# Patient Record
Sex: Female | Born: 1965 | Race: White | Hispanic: Yes | Marital: Married | Smoking: Never smoker
Health system: Southern US, Community
[De-identification: ages and names within clinical notes are randomized; demographics above are authoritative.]

## PROBLEM LIST (undated history)

## (undated) DIAGNOSIS — L309 Dermatitis, unspecified: Secondary | ICD-10-CM

## (undated) DIAGNOSIS — F32A Depression, unspecified: Secondary | ICD-10-CM

## (undated) DIAGNOSIS — F329 Major depressive disorder, single episode, unspecified: Secondary | ICD-10-CM

## (undated) DIAGNOSIS — F419 Anxiety disorder, unspecified: Secondary | ICD-10-CM

## (undated) HISTORY — PX: ENDOMETRIAL ABLATION: SHX621

## (undated) HISTORY — DX: Depression, unspecified: F32.A

## (undated) HISTORY — DX: Anxiety disorder, unspecified: F41.9

## (undated) HISTORY — DX: Major depressive disorder, single episode, unspecified: F32.9

## (undated) HISTORY — DX: Dermatitis, unspecified: L30.9

---

## 1998-06-13 ENCOUNTER — Other Ambulatory Visit: Admission: RE | Admit: 1998-06-13 | Discharge: 1998-06-13 | Payer: Self-pay | Admitting: Obstetrics and Gynecology

## 1999-07-11 ENCOUNTER — Other Ambulatory Visit: Admission: RE | Admit: 1999-07-11 | Discharge: 1999-07-11 | Payer: Self-pay | Admitting: Obstetrics and Gynecology

## 2000-09-03 ENCOUNTER — Other Ambulatory Visit: Admission: RE | Admit: 2000-09-03 | Discharge: 2000-09-03 | Payer: Self-pay | Admitting: Gynecology

## 2002-05-24 ENCOUNTER — Other Ambulatory Visit: Admission: RE | Admit: 2002-05-24 | Discharge: 2002-05-24 | Payer: Self-pay | Admitting: Gynecology

## 2002-10-08 ENCOUNTER — Encounter: Payer: Self-pay | Admitting: Emergency Medicine

## 2002-10-08 ENCOUNTER — Emergency Department (HOSPITAL_COMMUNITY): Admission: EM | Admit: 2002-10-08 | Discharge: 2002-10-08 | Payer: Self-pay | Admitting: Emergency Medicine

## 2002-10-21 ENCOUNTER — Encounter: Payer: Self-pay | Admitting: Gastroenterology

## 2002-10-25 ENCOUNTER — Ambulatory Visit (HOSPITAL_COMMUNITY): Admission: RE | Admit: 2002-10-25 | Discharge: 2002-10-25 | Payer: Self-pay | Admitting: Gastroenterology

## 2002-10-25 ENCOUNTER — Encounter: Payer: Self-pay | Admitting: Gastroenterology

## 2004-06-27 ENCOUNTER — Other Ambulatory Visit: Admission: RE | Admit: 2004-06-27 | Discharge: 2004-06-27 | Payer: Self-pay | Admitting: Gynecology

## 2005-01-07 ENCOUNTER — Ambulatory Visit: Payer: Self-pay | Admitting: Pulmonary Disease

## 2005-07-30 ENCOUNTER — Ambulatory Visit: Payer: Self-pay | Admitting: Pulmonary Disease

## 2006-02-13 ENCOUNTER — Ambulatory Visit: Payer: Self-pay | Admitting: Pulmonary Disease

## 2006-10-08 ENCOUNTER — Other Ambulatory Visit: Admission: RE | Admit: 2006-10-08 | Discharge: 2006-10-08 | Payer: Self-pay | Admitting: Gynecology

## 2006-10-28 ENCOUNTER — Ambulatory Visit: Payer: Self-pay | Admitting: Internal Medicine

## 2006-10-28 LAB — CONVERTED CEMR LAB
BUN: 12 mg/dL (ref 6–23)
CO2: 28 meq/L (ref 19–32)
Calcium: 8.9 mg/dL (ref 8.4–10.5)
Chloride: 105 meq/L (ref 96–112)
Creatinine, Ser: 0.7 mg/dL (ref 0.4–1.2)
GFR calc Af Amer: 120 mL/min
GFR calc non Af Amer: 100 mL/min
Glucose, Bld: 87 mg/dL (ref 70–99)
Potassium: 4.2 meq/L (ref 3.5–5.1)
Sodium: 139 meq/L (ref 135–145)

## 2006-11-07 ENCOUNTER — Encounter: Admission: RE | Admit: 2006-11-07 | Discharge: 2006-11-07 | Payer: Self-pay | Admitting: Internal Medicine

## 2006-12-01 ENCOUNTER — Ambulatory Visit: Payer: Self-pay | Admitting: Internal Medicine

## 2007-02-26 DIAGNOSIS — I10 Essential (primary) hypertension: Secondary | ICD-10-CM | POA: Insufficient documentation

## 2007-03-02 ENCOUNTER — Ambulatory Visit: Payer: Self-pay | Admitting: Internal Medicine

## 2007-04-06 ENCOUNTER — Ambulatory Visit: Payer: Self-pay | Admitting: Internal Medicine

## 2007-04-06 ENCOUNTER — Telehealth (INDEPENDENT_AMBULATORY_CARE_PROVIDER_SITE_OTHER): Payer: Self-pay | Admitting: *Deleted

## 2007-04-06 DIAGNOSIS — J449 Chronic obstructive pulmonary disease, unspecified: Secondary | ICD-10-CM | POA: Insufficient documentation

## 2007-04-06 DIAGNOSIS — J4489 Other specified chronic obstructive pulmonary disease: Secondary | ICD-10-CM | POA: Insufficient documentation

## 2007-06-22 ENCOUNTER — Telehealth (INDEPENDENT_AMBULATORY_CARE_PROVIDER_SITE_OTHER): Payer: Self-pay | Admitting: *Deleted

## 2007-08-17 ENCOUNTER — Emergency Department (HOSPITAL_COMMUNITY): Admission: EM | Admit: 2007-08-17 | Discharge: 2007-08-17 | Payer: Self-pay | Admitting: Emergency Medicine

## 2007-08-19 ENCOUNTER — Ambulatory Visit: Payer: Self-pay | Admitting: Internal Medicine

## 2007-08-19 ENCOUNTER — Telehealth (INDEPENDENT_AMBULATORY_CARE_PROVIDER_SITE_OTHER): Payer: Self-pay | Admitting: *Deleted

## 2007-08-19 DIAGNOSIS — J019 Acute sinusitis, unspecified: Secondary | ICD-10-CM | POA: Insufficient documentation

## 2007-10-16 ENCOUNTER — Other Ambulatory Visit: Admission: RE | Admit: 2007-10-16 | Discharge: 2007-10-16 | Payer: Self-pay | Admitting: Gynecology

## 2008-03-24 ENCOUNTER — Telehealth (INDEPENDENT_AMBULATORY_CARE_PROVIDER_SITE_OTHER): Payer: Self-pay | Admitting: *Deleted

## 2008-03-24 ENCOUNTER — Encounter (INDEPENDENT_AMBULATORY_CARE_PROVIDER_SITE_OTHER): Payer: Self-pay | Admitting: *Deleted

## 2008-05-06 ENCOUNTER — Encounter: Admission: RE | Admit: 2008-05-06 | Discharge: 2008-05-06 | Payer: Self-pay | Admitting: Gynecology

## 2009-04-14 ENCOUNTER — Encounter: Payer: Self-pay | Admitting: Gynecology

## 2009-04-14 ENCOUNTER — Ambulatory Visit: Payer: Self-pay | Admitting: Gynecology

## 2009-04-14 ENCOUNTER — Other Ambulatory Visit: Admission: RE | Admit: 2009-04-14 | Discharge: 2009-04-14 | Payer: Self-pay | Admitting: Gynecology

## 2009-04-21 ENCOUNTER — Encounter: Payer: Self-pay | Admitting: Gastroenterology

## 2009-05-08 ENCOUNTER — Encounter: Admission: RE | Admit: 2009-05-08 | Discharge: 2009-05-08 | Payer: Self-pay | Admitting: Gynecology

## 2009-05-22 ENCOUNTER — Ambulatory Visit: Payer: Self-pay | Admitting: Gastroenterology

## 2009-05-22 DIAGNOSIS — R1013 Epigastric pain: Secondary | ICD-10-CM

## 2009-05-22 DIAGNOSIS — K3189 Other diseases of stomach and duodenum: Secondary | ICD-10-CM | POA: Insufficient documentation

## 2009-06-14 ENCOUNTER — Encounter: Admission: RE | Admit: 2009-06-14 | Discharge: 2009-09-12 | Payer: Self-pay | Admitting: Gynecology

## 2010-11-13 NOTE — Assessment & Plan Note (Signed)
Summary: MID EPIGASTRIC PAIN, BLOATING..EM   History of Present Illness Visit Type: consult Primary GI MD: Melvia Heaps MD [facility] Requesting Valeda Corzine: Reynaldo Minium, MD Chief Complaint: epigastric pain, bloating History of Present Illness:   Marcia Davis is a pleasant 45 year old Hispanic female referred at the request of Dr. Lily Peer for evaluation of abdominal pain.  For years she has been complaining of mid epigastric and upper epigastric pain that radiates to her back and chest after eating leafy vegetables.  Pain is moderately severe.  It is not accompanied  by nausea,  vomiting or pyrosis.  She is on no  gastric irritants including nonsteroidals.  Endoscopy in 2004 for similar complaints revealed a benign fundic gland polyp.  She apparently underwent an abdominal ultrasound within the past 2 weeks that was unremarkable.   GI Review of Systems    Reports abdominal pain, belching, and  bloating.     Location of  Abdominal pain: epigastric area.    Denies acid reflux, chest pain, dysphagia with liquids, dysphagia with solids, heartburn, loss of appetite, nausea, vomiting, vomiting blood, weight loss, and  weight gain.        Denies anal fissure, black tarry stools, change in bowel habit, constipation, diarrhea, diverticulosis, fecal incontinence, heme positive stool, hemorrhoids, irritable bowel syndrome, jaundice, light color stool, liver problems, rectal bleeding, and  rectal pain. Preventive Screening-Counseling & Management  Alcohol-Tobacco     Smoking Status: never      Drug Use:  no.      Current Medications (verified): 1)  Cymbalta 30 Mg Cpep (Duloxetine Hcl) .... Take 1 Capsule By Mouth Single Dose 2)  Advair Diskus 500-50 Mcg/dose Misc (Fluticasone-Salmeterol) .... Inhale 1 Puff As Directed Twice A Day 3)  Ventolin Hfa 108 (90 Base) Mcg/act  Aers (Albuterol Sulfate) .... 2 Puff Qid As Needed Wheezing 4)  Fluconazole 150 Mg  Tabs (Fluconazole) .... One P.o. Daily P.r.n. Yeast  Infection  Allergies (verified): 1)  ! Codeine  Past History:  Past Medical History: Asthma GERD Idiopathic constipation Irritable Bowel Syndrome  Past Surgical History: Unremarkable  Family History: Family History Hypertension No FH of Colon Cancer:  Social History: Married, 1 boy birth-control: husband's vasectomy Patient has never smoked.  Alcohol Use - no Daily Caffeine Use 3 cups/day Illicit Drug Use - no Smoking Status:  never Drug Use:  no  Review of Systems  The patient denies allergy/sinus, anemia, anxiety-new, arthritis/joint pain, back pain, blood in urine, breast changes/lumps, change in vision, confusion, cough, coughing up blood, depression-new, fainting, fatigue, fever, headaches-new, hearing problems, heart murmur, heart rhythm changes, itching, menstrual pain, muscle pains/cramps, night sweats, nosebleeds, pregnancy symptoms, shortness of breath, skin rash, sleeping problems, sore throat, swelling of feet/legs, swollen lymph glands, thirst - excessive , urination - excessive , urination changes/pain, urine leakage, vision changes, and voice change.    Vital Signs:  Patient profile:   45 year old female Height:      62 inches Weight:      233 pounds BMI:     42.77 Pulse rate:   68 / minute Pulse rhythm:   regular BP sitting:   104 / 80  (right arm) Cuff size:   large  Vitals Entered By: Francee Piccolo CMA Duncan Dull) (May 22, 2009 2:07 PM)  Physical Exam  Additional Exam:  On physical exam she is healthy-appearing female  skin: anicteric HEENT: normocephalic; PEERLA; no nasal or pharyngeal abnormalities neck: supple nodes: no cervical lymphadenopathy chest: clear to ausculatation and percussion  heart: no murmurs, gallops, or rubs abd: soft, nontender; BS normoactive; no abdominal masses, tenderness, organomegaly rectal: deferred ext: no cynanosis, clubbing, edema skeletal: no deformities neuro: oriented x 3; no focal  abnormalities    Impression & Recommendations:  Problem # 1:  DYSPEPSIA&OTHER SPEC DISORDERS FUNCTION STOMACH (ICD-536.8) Symptoms are nonspecific and probably are secondary to  maldigestion.  There is no evidence for biliary tract disease.  This would be an atypical presentation for active peptic disease. #1 food avoidance #2 hyomax p.r.n. for pain  I carefully instructed the patient contact me if symptoms persist or are not improved with anticholinergic therapy.  Patient Instructions: 1)  CC Dr. Reynaldo Minium 2)  Gave pt printed prescription of Hyomax 3)  You are instructed to call back if pain worsens 4)  The medication list was reviewed and reconciled.  All changed / newly prescribed medications were explained.  A complete medication list was provided to the patient / caregiver. Prescriptions: HYOMAX-SL 0.125 MG SUBL (HYOSCYAMINE SULFATE) take 2 tabs sublingual q.4 h. p.r.n.  #25 x 2   Entered and Authorized by:   Louis Meckel MD   Signed by:   Louis Meckel MD on 05/22/2009   Method used:   Print then Give to Patient   RxID:   [id number]

## 2010-11-13 NOTE — Progress Notes (Signed)
Summary: PAZ--PERSONAL QUESTION  Phone Note Call from Patient Call back at Work Phone [phone]   Caller: Patient Summary of Call:  PT CALLING WITH A PERSONAL QUESTION ONLY FOR STACIA [phone] Initial call taken by: Freddy Jaksch,  June 22, 2007 12:32 PM  Follow-up for Phone Call        Left message on machine to return call. ..................................................................Marland KitchenShary Decamp  June 22, 2007 12:47 PM need  bottle of cymbalta samples - waiting for [facility] rx to come in mail ..................................................................Marland KitchenShary Decamp  June 22, 2007 4:13 PM

## 2010-11-13 NOTE — Progress Notes (Signed)
Summary: paz-asthmatic symptoms   Phone Note Call from Patient Call back at Work Phone [phone]   Caller: Patient Reason for Call: Acute Illness Summary of Call: sore throat and sore ears. hard time breathing with tight chest even though she is using her inhaler. Initial call taken by: Gwen Pounds,  April 06, 2007 10:05 AM  Follow-up for Phone Call        spoke withj pt ov sched 2:30 pt aware being worked in [place] per [place] Follow-up by: Kandice Hams,  April 06, 2007 11:19 AM

## 2010-11-13 NOTE — Assessment & Plan Note (Signed)
Summary: sinus inf,cough,wheezing/alr pt aware working in   Vital Signs:  Patient Profile:   45 Years Old Female Weight:      210.8 pounds Temp:     98.8 degrees F oral BP sitting:   140 / 80  Vitals Entered By: Shary Decamp (August 19, 2007 1:17 PM)                 Chief Complaint:  sick x 11/1; achy, ST, HA, nasal congestion, cough (dry), and chest/back pain - went to [facility] 11/3 dx with virus.  History of Present Illness: sick x 11/1; achy, ST, HA, nasal congestion, cough (dry), chest/back pain - went to [facility] 11/3 dx with virus hurting everywhere: eyeballs, neck , teeth   Current Allergies (reviewed today): ! CODEINE     Review of Systems       no fever no sputum no n-v-diarrhea wheezing increased  since this illness, used to be  well control up until now: using advair 3 times a day    Physical Exam  General:     alert and well-developed.  non toxic Head:     face symetric sinus tender to touch Ears:     nl TM Nose:     congested Mouth:     no red Lungs:     normal respiratory effort and no intercostal retractions.  few ronchi, no wheezes    Impression & Recommendations:  Problem # 1:  SINUSITIS- ACUTE-NOS (ICD-461.9) see below  Her updated medication list for this problem includes:    Amoxicillin 500 Mg Tabs (Amoxicillin) .Marland Kitchen... 2 by mouth two times a day x 10 days  The following medications were removed from the medication list:    Zithromax Z-pak Tabs (Azithromycin tabs) .Marland Kitchen... As directed  Her updated medication list for this problem includes:    Amoxicillin 500 Mg Tabs (Amoxicillin) .Marland Kitchen... 2 by mouth two times a day x 10 days   Complete Medication List: 1)  Cymbalta 30 Mg Cpep (Duloxetine hcl) .... Take 1 capsule by mouth single dose 2)  Advair Diskus 500-50 Mcg/dose Misc (Fluticasone-salmeterol) .... Inhale 1 puff as directed twice a day 3)  Ventolin Hfa 108 (90 Base) Mcg/act Aers (Albuterol sulfate) .... 2 puff qid as needed wheezing 4)   Fluconazole 150 Mg Tabs (Fluconazole) .... One p.o. daily p.r.n. yeast infection 5)  Amoxicillin 500 Mg Tabs (Amoxicillin) .... 2 by mouth two times a day x 10 days 6)  Prednisone 10 Mg Tabs (Prednisone) .... 3 by mouth x 3 days, 2 by mouth once daily x3 days , 1 by mouth once daily x 3 days 7)  Vicodin 5-500 Mg Tabs (Hydrocodone-acetaminophen) .Marland Kitchen.. 1 by mouth qid as needed pain and cough   Patient Instructions: 1)  amoxicillin (antibiotic) 2)  Prednisone as prescribed 3)  Advair twice a day only 4)  Ventolin as needed for wheezing 5)  For cough : Mucinex DM 6)  I cough or pain severe: Vicodin (drowsiness) 7)  call if not better by next week orof worse 8)  We need to talk about your asthma once you are better (decrease Advair)    Prescriptions: VICODIN 5-500 MG  TABS (HYDROCODONE-ACETAMINOPHEN) 1 by mouth qid as needed pain and cough  #20 x 0   Entered and Authorized by:   Nolon Rod. Paz MD   Signed by:   Nolon Rod. Paz MD on 08/19/2007   Method used:   Print then Give to Patient   RxID:  [id number] PREDNISONE 10 MG  TABS (PREDNISONE) 3 by mouth x 3 days, 2 by mouth once daily x3 days , 1 by mouth once daily x 3 days  #18 x 0   Entered and Authorized by:   Nolon Rod. Paz MD   Signed by:   Nolon Rod. Paz MD on 08/19/2007   Method used:   Print then Give to Patient   RxID:   [id number] AMOXICILLIN 500 MG  TABS (AMOXICILLIN) 2 by mouth two times a day x 10 days  #40 x 0   Entered and Authorized by:   Nolon Rod. Paz MD   Signed by:   Nolon Rod. Paz MD on 08/19/2007   Method used:   Print then Give to Patient   RxID:   [id number] VENTOLIN HFA 108 (90 BASE) MCG/ACT  AERS (ALBUTEROL SULFATE) 2 puff qid as needed wheezing  #1 x 3   Entered and Authorized by:   Nolon Rod. Paz MD   Signed by:   Nolon Rod. Paz MD on 08/19/2007   Method used:   Print then Give to Patient   RxID:   [id number]  ]

## 2010-11-13 NOTE — Procedures (Signed)
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    with biopsy(s)/brushing(s). CPT: D1846139.  Personnel: Endoscopist: Barbette Hair. Arlyce Dice, MD.  Indications Symptoms: Abdominal pain,  History  Pre-Exam Physical: Performed Oct 21, 2002  Entire physical exam was normal.  Exam Exam Info: Maximum depth of insertion Duodenum, intended Duodenum. Vocal cords visualized. Gastric retroflexion performed. ASA Classification: I. Tolerance: excellent.  Sedation Meds: Residual sedation present from prior procedure today. Fentanyl given IV. Versed given IV. Cetacaine Spray 2 sprays given aerosolized.  Monitoring: BP and pulse monitoring done. Oximetry used. Supplemental O2 given at 2 Liters.  Findings POLYP: in Cardia. Maximum size: 5 mm. Procedure: biopsy without cautery, ICD9: Neoplasia, Benign, GI Tract, NEC/NOS: 211.9.   Assessment Abnormal examination, see findings above.  Diagnoses: 211.9: Neoplasia, Benign, GI Tract, NEC/NOS.   Events  Unplanned Intervention: No unplanned interventions were required.  Unplanned Events: There were no complications. Plans Medication(s): Await pathology.  Scheduling: Office Visit, to [employer]. Arlyce Dice, MD, around Oct 28, 2002.  Nuclear Medicine, around Oct 22, 2002.    This report was created from the original endoscopy report, which was reviewed and signed by the above listed endoscopist.

## 2010-11-13 NOTE — Progress Notes (Signed)
Summary: asthma flare up - dr Drue Novel lmom to call  Phone Note Call from Patient Call back at Work Phone [phone]   Caller: Patient Summary of Call: patient has asthma having difficult time breathing - inhaler not working - wants appt today Initial call taken by: Okey Regal [place],  August 19, 2007 9:04 AM  Follow-up for Phone Call        lmom to call Follow-up by: Kandice Hams,  August 19, 2007 11:37 AM  Additional Follow-up for Phone Call Additional follow up Details #1::        spoke with pt said [facility] on monday they said i have a viral inf gave me some otc meds, feel worse feverish ,wheezing taking mu advair more than i should, coughing ov sched for today with dr Drue Novel Additional Follow-up by: Kandice Hams,  August 19, 2007 11:59 AM

## 2010-11-13 NOTE — Progress Notes (Signed)
Summary: Paz--REfill  Phone Note Refill Request   Refills Requested: Medication #1:  CYMBALTA 30 MG CPEP Take 1 capsule by mouth single dose [facility]--[phone]  Initial call taken by: Freddy Jaksch,  March 24, 2008 8:26 AM      Prescriptions: CYMBALTA 30 MG CPEP (DULOXETINE HCL) Take 1 capsule by mouth single dose  #90 x 0   Entered by:   Shary Decamp   Authorized by:   Nolon Rod. Paz MD   Signed by:   Shary Decamp on 03/24/2008   Method used:   Printed then faxed to ...         RxID:   [id number]

## 2010-11-13 NOTE — Procedures (Signed)
Summary: Flexible Sigmoidoscopy   Flexible Sigmoidoscopy  Procedure date:  10/21/2002  Findings:      Results: Normal.   Comments:      Location: [facility].  Patient Name: Marcia Davis, Marcia Davis MRN:  Procedure Procedures: Flexible Proctosigmoidoscopy CPT: [address].  Personnel: Endoscopist: Barbette Hair. Arlyce Dice, MD.  Indications Symptoms: Hematochezia.  History  Pre-Exam Physical: Performed Oct 21, 2002.  Exam Exam: Extent visualized: Descending Colon. Extent of exam: 80 cm. Colon retroflexion performed. ASA Classification: I. Tolerance: excellent.  Monitoring: Pulse and BP monitoring, Oximetry used. Supplemental O2 given. at 2 Liters.  Sedation Meds: Ribunol 0.2 given IV. Fentanyl 100 mcg. given IV. Versed 10 mg. given IV. Cetacaine Spray given aerosolized.  Findings - NORMAL EXAM: Splenic Flexure to Rectum.   Assessment Normal examination.  Events  Unplanned Intervention: No intervention was required.  Unplanned Events: There were no complications. Plans Patient Education: Patient given standard instructions for: a normal exam.   This report was created from the original endoscopy report, which was reviewed and signed by the above listed endoscopist.

## 2010-11-13 NOTE — Letter (Signed)
Summary: Primary Care Appointment Letter  [facility] [place]  [address] [place], [place] [address]   Phone: [phone]  Fax: [phone]    03/24/2008 MRN: [id number]  CODIE HAINER [address] [place], [place]  [address]  Dear Ms. Galyon,   Your Primary Care Physician  has indicated that:    ____X___it is time to schedule an appointment.  We received a call from your pharmacy requesting a refill for your Cymbalta. We have efilled your medication(s) for 3 month(s).  Please call our office @ [phone] and speak to our scheduler to schedule an office visit before your refill(s) run out.     _______you missed your appointment on______ and need to call and          reschedule.    _______you need to have lab work done.    _______you need to schedule an appointment discuss lab or test results.    _______you need to call to reschedule your appointment that is                       scheduled on _________.     Please call our office as soon as possible. Our phone number is [phone]          _________. Please press option 1. Our office is open 8a-12noon and 1p-5p, Monday through Friday.     Thank you,     Primary Care Scheduler

## 2010-11-13 NOTE — Assessment & Plan Note (Signed)
Summary: OK PER STACIA TO ADD///SPH  Medications Added CYMBALTA 30 MG CPEP (DULOXETINE HCL) Take 1 capsule by mouth single dose ADVAIR DISKUS 500-50 MCG/DOSE MISC (FLUTICASONE-SALMETEROL) Inhale 1 puff as directed twice a day ZITHROMAX Z-PAK   TABS (AZITHROMYCIN TABS) as directed PROVENTIL HFA   AERS (ALBUTEROL SULFATE AERS) two puffs q.i.d. p.r.n. asthma FLUCONAZOLE 150 MG  TABS (FLUCONAZOLE) one p.o. daily p.r.n. yeast infection      Allergies Added: NKDA  Vital Signs:  Patient Profile:   45 Years Old Female Weight:      210.8 pounds O2 Sat:      97 % Temp:     100.2 degrees F oral Pulse rate:   80 / minute Pulse rhythm:   regular BP sitting:   120 / 80  (left arm) Cuff size:   large  Vitals Entered By: Shary Decamp (April 06, 2007 3:05 PM)               Chief Complaint:  chest tight, ST, dizzy, and nasal congestions x last week; started with cough yest (dry); chest discomfort with cough.  History of Present Illness: sick for 6 days.  See above Over-the-counter medication helps a little  Current Allergies: No known allergies    Social History:    Married    birth-control: husband's vasectomy    Review of Systems       denies fever, no sputum production, very little nasal discharge.  Does have frontal headache. some diarrhea, watery, today only   Physical Exam  General:     alert and well-developed.  nontoxic Ears:     Normal Nose:     slight congested Mouth:     no redness or discharge Lungs:     normal respiratory effort, no intercostal retractions, and no accessory muscle use.  few rhonchi Heart:     normal rate, regular rhythm, and no murmur.      Impression & Recommendations:  Problem # 1:  BRONCHITIS-ACUTE (ICD-466.0) Diflucan prescribed in case she needs it  Her updated medication list for this problem includes:    Advair Diskus 500-50 Mcg/dose Misc (Fluticasone-salmeterol) ..... Inhale 1 puff as directed twice a day    Zithromax  Z-pak Tabs (Azithromycin tabs) .Marland Kitchen... As directed    Proventil Hfa Aers (Albuterol sulfate aers) .Marland Kitchen..Marland Kitchen Two puffs q.i.d. p.r.n. asthma     Problem # 2:  ASTHMA, CHRONIC OBSTRUCTIVE NOS (ICD-493.20) well controlled except for the last few days. does not  have albuterol at home, she has hardly ever use albuterol.  Will prescribe one today Her updated medication list for this problem includes:    Advair Diskus 500-50 Mcg/dose Misc (Fluticasone-salmeterol) ..... Inhale 1 puff as directed twice a day    Proventil Hfa Aers (Albuterol sulfate aers) .Marland Kitchen..Marland Kitchen Two puffs q.i.d. p.r.n. asthma   Medications Added to Medication List This Visit: 1)  Cymbalta 30 Mg Cpep (Duloxetine hcl) .... Take 1 capsule by mouth single dose 2)  Advair Diskus 500-50 Mcg/dose Misc (Fluticasone-salmeterol) .... Inhale 1 puff as directed twice a day 3)  Zithromax Z-pak Tabs (Azithromycin tabs) .... As directed 4)  Proventil Hfa Aers (Albuterol sulfate aers) .... Two puffs q.i.d. p.r.n. asthma 5)  Fluconazole 150 Mg Tabs (Fluconazole) .... One p.o. daily p.r.n. yeast infection   Patient Instructions: 1)  Get plenty of rest, drink lots of clear liquids, and use tylenol or Ibuprophen for fever and comfort. return in 7-10 days if you're not better:sooner if you're feeling worse. 2)  Mucinex DM 3)  take antibiotics as prescribed. 4)  If you her asthma gets worse, use albuterol.    Prescriptions: FLUCONAZOLE 150 MG  TABS (FLUCONAZOLE) one p.o. daily p.r.n. yeast infection  #1 x 0   Entered and Authorized by:   Nolon Rod. Paz MD   Signed by:   Nolon Rod. Paz MD on 04/06/2007   Method used:   Print then Give to Patient   RxID:   [id number] PROVENTIL HFA   AERS (ALBUTEROL SULFATE AERS) two puffs q.i.d. p.r.n. asthma  #1 x 3   Entered and Authorized by:   Nolon Rod. Paz MD   Signed by:   Nolon Rod. Paz MD on 04/06/2007   Method used:   Print then Give to Patient   RxID:   [id number] ZITHROMAX Z-PAK   TABS (AZITHROMYCIN  TABS) as directed  #1 x 0   Entered and Authorized by:   Nolon Rod. Paz MD   Signed by:   Nolon Rod. Paz MD on 04/06/2007   Method used:   Print then Give to Patient   RxID:   [id number]

## 2010-11-14 ENCOUNTER — Ambulatory Visit: Admit: 2010-11-14 | Payer: Self-pay | Admitting: Gynecology

## 2010-11-14 ENCOUNTER — Encounter: Payer: Self-pay | Admitting: Gynecology

## 2010-11-15 ENCOUNTER — Encounter: Payer: Self-pay | Admitting: Gynecology

## 2010-11-27 ENCOUNTER — Other Ambulatory Visit (HOSPITAL_COMMUNITY)
Admission: RE | Admit: 2010-11-27 | Discharge: 2010-11-27 | Disposition: A | Discharge status: Home / Self Care | Payer: BC Managed Care – PPO | Source: Ambulatory Visit | Attending: Gynecology | Admitting: Gynecology

## 2010-11-27 ENCOUNTER — Other Ambulatory Visit: Payer: Self-pay | Admitting: Gynecology

## 2010-11-27 ENCOUNTER — Encounter: Payer: BC Managed Care – PPO | Admitting: Gynecology

## 2010-11-27 DIAGNOSIS — Z833 Family history of diabetes mellitus: Secondary | ICD-10-CM

## 2010-11-27 DIAGNOSIS — Z01419 Encounter for gynecological examination (general) (routine) without abnormal findings: Secondary | ICD-10-CM

## 2010-11-27 DIAGNOSIS — Z124 Encounter for screening for malignant neoplasm of cervix: Secondary | ICD-10-CM | POA: Insufficient documentation

## 2010-11-27 DIAGNOSIS — R635 Abnormal weight gain: Secondary | ICD-10-CM

## 2010-11-27 DIAGNOSIS — Z13220 Encounter for screening for lipoid disorders: Secondary | ICD-10-CM

## 2010-11-28 ENCOUNTER — Other Ambulatory Visit: Payer: Self-pay | Admitting: Gynecology

## 2010-11-28 DIAGNOSIS — Z1231 Encounter for screening mammogram for malignant neoplasm of breast: Secondary | ICD-10-CM

## 2010-12-05 ENCOUNTER — Ambulatory Visit
Admission: RE | Admit: 2010-12-05 | Discharge: 2010-12-05 | Disposition: A | Discharge status: Home / Self Care | Payer: BC Managed Care – PPO | Source: Ambulatory Visit | Attending: Gynecology | Admitting: Gynecology

## 2010-12-05 DIAGNOSIS — Z1231 Encounter for screening mammogram for malignant neoplasm of breast: Secondary | ICD-10-CM

## 2010-12-11 ENCOUNTER — Encounter (INDEPENDENT_AMBULATORY_CARE_PROVIDER_SITE_OTHER): Payer: BC Managed Care – PPO

## 2010-12-11 DIAGNOSIS — Z13820 Encounter for screening for osteoporosis: Secondary | ICD-10-CM

## 2011-03-01 NOTE — Assessment & Plan Note (Signed)
[facility]                        [place] OFFICE NOTE   CHEREE, FOWLES                        MRN:          [id number]  DATE:10/28/2006                            DOB:          08-07-1966    CHIEF COMPLAINT:  Referred by Dr. Lily Peer.   HISTORY OF PRESENT ILLNESS:  Mrs. Shivley is a 45 year old, Hispanic  female who came to the office at the request of Dr. Lily Peer, her  gynecologist.  He is basically concerned about the patient's blood  pressure.  The patient stated that she was feeling well until around  December 25 when she had severe, right-sided headache that lasted for 24  hours.  This headache is described as the worst of her life and was  associated with nausea and diarrhea.  Since then, she has not felt well.  She has multiple symptoms including dizziness, light-headedness, chills,  extreme fatigue, occasional nausea, sore throat, and decrease energy.  She saw Dr. Lily Peer about 10 days ago with above symptoms and  apparently she had a breakdown during that office visit and started to  cry.  At that time, Dr. Lily Peer started her on Cymbalta 30 mg with the  idea to switch to 60 mg soon.  Today, she is here to see about her blood  pressure, although she states that she feels better now than she did a  week ago.  She admits that from time-to-time she has crying spells, she  believes related with her periods.   PAST MEDICAL HISTORY:  1. She is G1, P1.  2. No history of diabetes, high cholesterol, or hypertension.   FAMILY HISTORY:  1. Mother has hypertension and high cholesterol.  2. Father, we do not have much information about his health.  3. Grandparents have a history of heart disease.   SOCIAL HISTORY:  Does not smoke or drink.  She is married and has 1  child.   REVIEW OF SYSTEMS:  Denies any fever, sinus congestion.  She did have  diarrhea a couple of weeks ago but none since then.  Denies any  myalgias.  She  does have some stress in her life but not in a way that  she thinks is out of hand.  Things at home and at work are okay.  Denies  any major sadness or anxiety except when she is on her period.   PHYSICAL EXAMINATION:  The patient is alert, oriented in no apparent  distress.  She is 5 feet 2-1/4 inches tall.  She weighs 216 pounds.  Pulse 80.  Blood pressure 140/100.  NECK:  No nuchal rigidity.  LUNGS:  Clear to auscultation bilaterally.  CARDIOVASCULAR:  Regular rate and rhythm without a murmur.  EXTREMITIES:  No pitting edema.  NECK:  No thyromegaly.  ABDOMEN:  Not distended, soft, good bowel sounds, and no bruits.  NEUROLOGIC:  Speech, gait, and motor are intact.  Extraocular movements  and pupils are normal.   MEDICATIONS:  1. Advair 500/50 b.i.d.  2. Ten days ago, she was started on Provera and Vivelle by Dr.      Lily Peer.  3. A week ago, she was started on Cymbalta.   ALLERGIES:  No known drug allergies.   LABORATORY AND X-RAYS:  At the gynecologist, she had a normal TSH,  cholesterol, blood sugar, and CBC, except for a hemoglobin of 11.4 for  which she was started on iron.   ASSESSMENT AND PLAN:  1. The patient has a modest elevation of her blood pressure.  I      recommend to her a low-sodium diet.  Routine exercise and weight      loss.  I gave her some information about the [employer] program that      we run in the office.  I am going to check a BMP on her today and I      asked her to come back in 3 weeks to see how her blood pressure is.  2. The patient described an intense headache that happened at      Christmas and lasted 24 hours.  It was quite intense and the worst      of her life.  It is entirely possible that this was a migraine.      However, given the intensity of the pain, I think we need to      further investigate this with a MRI and MRA to rule out the      possibility of an aneurysm and a thunderclap headache.  We will      schedule that test  soon.  3. She has multiple other symptoms including fatigue, sore throat,      lack of energy.  Her symptoms are better now after a week of      Cymbalta.  I warned her just like her gynecologist did about this      representing depression and anxiety.  The patient is not convinced      that that is the case.  I recommend her to stay on a low dose of      Cymbalta, which is 30 mg for now.  She is somewhat reluctant to do      that.  I eventually gave her samples of Cymbalta 30.  If so      desired, she should stay on that dose of Cymbalta and come back in      3 weeks for reassessment.  4. Time spent with the patient was in excess of 30 minutes.     Willow Ora, MD  Electronically Signed    JP/MedQ  DD: 10/28/2006  DT: 10/29/2006  Job #: [id number]

## 2011-04-04 ENCOUNTER — Ambulatory Visit (INDEPENDENT_AMBULATORY_CARE_PROVIDER_SITE_OTHER): Payer: BC Managed Care – PPO | Admitting: Gynecology

## 2011-04-04 DIAGNOSIS — N898 Other specified noninflammatory disorders of vagina: Secondary | ICD-10-CM

## 2011-04-04 DIAGNOSIS — B373 Candidiasis of vulva and vagina: Secondary | ICD-10-CM

## 2011-06-12 ENCOUNTER — Encounter: Payer: Self-pay | Admitting: Gynecology

## 2011-06-12 ENCOUNTER — Ambulatory Visit (INDEPENDENT_AMBULATORY_CARE_PROVIDER_SITE_OTHER): Payer: BC Managed Care – PPO | Admitting: Gynecology

## 2011-06-12 VITALS — BP 126/80

## 2011-06-12 DIAGNOSIS — J45909 Unspecified asthma, uncomplicated: Secondary | ICD-10-CM | POA: Insufficient documentation

## 2011-06-12 DIAGNOSIS — B372 Candidiasis of skin and nail: Secondary | ICD-10-CM

## 2011-06-12 DIAGNOSIS — G43909 Migraine, unspecified, not intractable, without status migrainosus: Secondary | ICD-10-CM | POA: Insufficient documentation

## 2011-06-12 MED ORDER — NYSTATIN-TRIAMCINOLONE 100000-0.1 UNIT/GM-% EX OINT: TOPICAL_OINTMENT | Freq: Two times a day (BID) | CUTANEOUS | Status: DC

## 2011-06-12 NOTE — Progress Notes (Signed)
Patient presented to the office today with a complaint of growing itching and redness. She had tried over-the-counter antifungal agent increase with no avail.  Pelvic exam. Left groin region with erythematous scaly area apparent Candida intertrigo Right groin region with the same but a mild degree. External genitalia and no other abnormality noted no vaginal discharge reported.  Plan: Patient will be placed on Mytrex cream to apply twice a day for 7-10 days and to refills provided. If this does not responding to treatment she will need to return back for further assessment in the event that this may be psoriasis variant.

## 2011-08-08 ENCOUNTER — Telehealth: Payer: Self-pay | Admitting: Gastroenterology

## 2011-08-08 NOTE — Telephone Encounter (Signed)
Pt complaining of abdominal pain, requesting to be seen. Pt scheduled to see Dr. Arlyce Dice 08/26/11@3 :15pm. Pt aware of appt date and time.

## 2011-08-26 ENCOUNTER — Ambulatory Visit (INDEPENDENT_AMBULATORY_CARE_PROVIDER_SITE_OTHER): Payer: BC Managed Care – PPO | Admitting: Gastroenterology

## 2011-08-26 ENCOUNTER — Encounter: Payer: Self-pay | Admitting: Gastroenterology

## 2011-08-26 DIAGNOSIS — R109 Unspecified abdominal pain: Secondary | ICD-10-CM

## 2011-08-26 DIAGNOSIS — R1013 Epigastric pain: Secondary | ICD-10-CM

## 2011-08-26 DIAGNOSIS — K3189 Other diseases of stomach and duodenum: Secondary | ICD-10-CM

## 2011-08-26 MED ORDER — DEXLANSOPRAZOLE 60 MG PO CPDR: 60.0000 mg | DELAYED_RELEASE_CAPSULE | Freq: Every day | ORAL | Status: AC

## 2011-08-26 MED ORDER — HYOSCYAMINE SULFATE ER 0.375 MG PO TBCR: EXTENDED_RELEASE_TABLET | ORAL | Status: DC

## 2011-08-26 NOTE — Progress Notes (Signed)
Marcia Davis is a pleasant 45 year old Hispanic female self-referred for evaluation of abdominal pain. For years she's been complaining of postprandial upper epigastric pain. Pain is without radiation. It is not predictably brought on by any particular foods although she finds that she has increasing food intolerance. She underwent upper endoscopy in 2004 for similar complaints. A gastric polyp was seen. She apparently underwent ultrasound around that time as well that was unrevealing. She does not awaken with pain. Typically she develops pain after eating. Should she go several hours after eating she'll develop upper abdominal discomfort as well. She denies pyrosis or dysphagia. She has taken PPIs in the past without much improvement.      Past Medical History  Diagnosis Date  . Asthma   . Anxiety   . Depression   . Migraine    Past Surgical History  Procedure Date  . Endometrial ablation     HER OPTION   Current Outpatient Prescriptions  Medication Sig Dispense Refill  . DULoxetine (CYMBALTA) 20 MG capsule Take 20 mg by mouth daily.        Marland Kitchen eletriptan (RELPAX) 20 MG tablet One tablet by mouth as needed for migraine headache.  If the headache improves and then returns, dose may be repeated after 2 hours have elapsed since first dose (do not exceed 80 mg per day). may repeat in 2 hours if necessary       . Fluticasone-Salmeterol (ADVAIR) 500-50 MCG/DOSE AEPB Inhale 1 puff into the lungs every 12 (twelve) hours.        Marland Kitchen nystatin-triamcinolone (MYCOLOG) ointment Apply topically 2 (two) times daily. Apply 7-10 days.  30 g  3  . vitamin C (ASCORBIC ACID) 500 MG tablet Take 500 mg by mouth daily.          reports that she has never smoked. She has never used smokeless tobacco. She reports that she does not drink alcohol or use illicit drugs. family history includes Diabetes in her mother; Heart disease in her maternal grandmother; and Hypertension in her mother.    Review of Systems:  Pertinent positive and negative review of systems were noted in the above HPI section. All other review of systems were otherwise negative.  Vital signs were reviewed in today's medical record Physical Exam: General: Well developed , well nourished, no acute distress Head: Normocephalic and atraumatic Eyes:  sclerae anicteric, EOMI Ears: Normal auditory acuity Mouth: No deformity or lesions Neck: Supple, no masses or thyromegaly Lungs: Clear throughout to auscultation Heart: Regular rate and rhythm; no murmurs, rubs or bruits Abdomen: Soft, non tender and non distended. No masses, hepatosplenomegaly or hernias noted. Normal Bowel sounds Rectal:deferred Musculoskeletal: Symmetrical with no gross deformities  Skin: No lesions on visible extremities Pulses:  Normal pulses noted Extremities: No clubbing, cyanosis, edema or deformities noted Neurological: Alert oriented x 4, grossly nonfocal Cervical Nodes:  No significant cervical adenopathy Inguinal Nodes: No significant inguinal adenopathy Psychological:  Alert and cooperative. Normal mood and affect

## 2011-08-26 NOTE — Assessment & Plan Note (Addendum)
Symptoms suggest non-ulcer dyspepsia (EGD for similar symptoms in 2004 negative except for gastric polyp).  Chronic cholecystitis is also a consideration.  Recommendations #1 trial of dexilant 60 mg daily #2 abdominal ultrasound; if negative I will obtain a HIDA scan #3 trial of hyomax twice a day

## 2011-08-26 NOTE — Patient Instructions (Signed)
Your Abdominal ultrasound is scheduled on 08/29/2011 at 8:30am to arrive at 8:15 This will be done on the first floor of [facility] in the Radiology Department If you need to reschedule this appointment please contact Radiology at [phone] to eat or drink after midnight We are sending in Nexium to pharmacy

## 2011-08-29 ENCOUNTER — Ambulatory Visit (HOSPITAL_COMMUNITY)
Admission: RE | Admit: 2011-08-29 | Discharge: 2011-08-29 | Disposition: A | Discharge status: Home / Self Care | Payer: BC Managed Care – PPO | Source: Ambulatory Visit | Attending: Gastroenterology | Admitting: Gastroenterology

## 2011-08-29 ENCOUNTER — Other Ambulatory Visit: Payer: Self-pay | Admitting: Gastroenterology

## 2011-08-29 ENCOUNTER — Telehealth: Payer: Self-pay

## 2011-08-29 DIAGNOSIS — R109 Unspecified abdominal pain: Secondary | ICD-10-CM | POA: Insufficient documentation

## 2011-08-29 NOTE — Telephone Encounter (Signed)
Message copied by Michele Mcalpine on Thu Aug 29, 2011  4:40 PM ------      Message from: Melvia Heaps D      Created: Thu Aug 29, 2011  4:07 PM       Informed patient if the ultrasound is negative. She needs a HIDA scan

## 2011-08-29 NOTE — Telephone Encounter (Signed)
Pt scheduled for HIDA scan at [facility] 09/20/11 arrival time 9:45am scan at 10am. Pt to be NPO after midnight. Left message for pt to call back.

## 2011-08-30 ENCOUNTER — Telehealth: Payer: Self-pay | Admitting: *Deleted

## 2011-08-30 MED ORDER — OMEPRAZOLE 40 MG PO CPDR: 40.0000 mg | DELAYED_RELEASE_CAPSULE | Freq: Every day | ORAL | Status: DC

## 2011-08-30 NOTE — Telephone Encounter (Signed)
Dr Arlyce Dice, This pts insurance does not want to cover Dexilant. I sent her in a script for Omeprazole to try.

## 2011-08-30 NOTE — Telephone Encounter (Signed)
ok 

## 2011-08-30 NOTE — Telephone Encounter (Signed)
Pt aware.

## 2011-09-20 ENCOUNTER — Other Ambulatory Visit (HOSPITAL_COMMUNITY): Payer: BC Managed Care – PPO

## 2012-02-05 ENCOUNTER — Other Ambulatory Visit: Payer: Self-pay | Admitting: Gynecology

## 2012-02-05 DIAGNOSIS — Z1231 Encounter for screening mammogram for malignant neoplasm of breast: Secondary | ICD-10-CM

## 2012-02-07 ENCOUNTER — Encounter: Payer: Self-pay | Admitting: Gynecology

## 2012-02-07 ENCOUNTER — Telehealth: Payer: Self-pay | Admitting: *Deleted

## 2012-02-07 ENCOUNTER — Ambulatory Visit (INDEPENDENT_AMBULATORY_CARE_PROVIDER_SITE_OTHER): Payer: BC Managed Care – PPO | Admitting: Gynecology

## 2012-02-07 VITALS — BP 118/74 | Ht 62.25 in | Wt 216.0 lb

## 2012-02-07 DIAGNOSIS — R5381 Other malaise: Secondary | ICD-10-CM

## 2012-02-07 DIAGNOSIS — Z8639 Personal history of other endocrine, nutritional and metabolic disease: Secondary | ICD-10-CM

## 2012-02-07 DIAGNOSIS — F329 Major depressive disorder, single episode, unspecified: Secondary | ICD-10-CM

## 2012-02-07 DIAGNOSIS — Z01419 Encounter for gynecological examination (general) (routine) without abnormal findings: Secondary | ICD-10-CM

## 2012-02-07 DIAGNOSIS — F32A Depression, unspecified: Secondary | ICD-10-CM

## 2012-02-07 DIAGNOSIS — F411 Generalized anxiety disorder: Secondary | ICD-10-CM

## 2012-02-07 DIAGNOSIS — Z833 Family history of diabetes mellitus: Secondary | ICD-10-CM

## 2012-02-07 DIAGNOSIS — L304 Erythema intertrigo: Secondary | ICD-10-CM

## 2012-02-07 DIAGNOSIS — F339 Major depressive disorder, recurrent, unspecified: Secondary | ICD-10-CM | POA: Insufficient documentation

## 2012-02-07 DIAGNOSIS — R638 Other symptoms and signs concerning food and fluid intake: Secondary | ICD-10-CM

## 2012-02-07 DIAGNOSIS — L538 Other specified erythematous conditions: Secondary | ICD-10-CM

## 2012-02-07 DIAGNOSIS — R5383 Other fatigue: Secondary | ICD-10-CM

## 2012-02-07 DIAGNOSIS — B372 Candidiasis of skin and nail: Secondary | ICD-10-CM

## 2012-02-07 DIAGNOSIS — R102 Pelvic and perineal pain: Secondary | ICD-10-CM

## 2012-02-07 DIAGNOSIS — F419 Anxiety disorder, unspecified: Secondary | ICD-10-CM | POA: Insufficient documentation

## 2012-02-07 DIAGNOSIS — Z87898 Personal history of other specified conditions: Secondary | ICD-10-CM

## 2012-02-07 DIAGNOSIS — N949 Unspecified condition associated with female genital organs and menstrual cycle: Secondary | ICD-10-CM

## 2012-02-07 LAB — CBC WITH DIFFERENTIAL/PLATELET
Basophils Absolute: 0 10*3/uL (ref 0.0–0.1)
Basophils Relative: 0 % (ref 0–1)
Eosinophils Absolute: 0.2 10*3/uL (ref 0.0–0.7)
Eosinophils Relative: 3 % (ref 0–5)
HCT: 39.6 % (ref 36.0–46.0)
Hemoglobin: 12.4 g/dL (ref 12.0–15.0)
Lymphocytes Relative: 36 % (ref 12–46)
Lymphs Abs: 2.2 10*3/uL (ref 0.7–4.0)
MCH: 26.8 pg (ref 26.0–34.0)
MCHC: 31.3 g/dL (ref 30.0–36.0)
MCV: 85.7 fL (ref 78.0–100.0)
Monocytes Absolute: 0.3 10*3/uL (ref 0.1–1.0)
Monocytes Relative: 6 % (ref 3–12)
Neutro Abs: 3.4 10*3/uL (ref 1.7–7.7)
Neutrophils Relative %: 55 % (ref 43–77)
Platelets: 241 10*3/uL (ref 150–400)
RBC: 4.62 MIL/uL (ref 3.87–5.11)
RDW: 15.3 % (ref 11.5–15.5)
WBC: 6.1 10*3/uL (ref 4.0–10.5)

## 2012-02-07 LAB — CHOLESTEROL, TOTAL: Cholesterol: 179 mg/dL (ref 0–200)

## 2012-02-07 LAB — COMPREHENSIVE METABOLIC PANEL
ALT: 15 U/L (ref 0–35)
AST: 17 U/L (ref 0–37)
Albumin: 4.6 g/dL (ref 3.5–5.2)
Alkaline Phosphatase: 76 U/L (ref 39–117)
BUN: 15 mg/dL (ref 6–23)
CO2: 25 mEq/L (ref 19–32)
Calcium: 9.1 mg/dL (ref 8.4–10.5)
Chloride: 104 mEq/L (ref 96–112)
Creat: 0.64 mg/dL (ref 0.50–1.10)
Glucose, Bld: 90 mg/dL (ref 70–99)
Potassium: 4.5 mEq/L (ref 3.5–5.3)
Sodium: 139 mEq/L (ref 135–145)
Total Bilirubin: 0.5 mg/dL (ref 0.3–1.2)
Total Protein: 7.1 g/dL (ref 6.0–8.3)

## 2012-02-07 MED ORDER — DULOXETINE HCL 20 MG PO CPEP: 20.0000 mg | ORAL_CAPSULE | Freq: Every day | ORAL | Status: DC

## 2012-02-07 MED ORDER — NYSTATIN-TRIAMCINOLONE 100000-0.1 UNIT/GM-% EX OINT: TOPICAL_OINTMENT | Freq: Two times a day (BID) | CUTANEOUS | Status: DC

## 2012-02-07 NOTE — Telephone Encounter (Signed)
Pt has question about the new pap guidelines, all question answered.

## 2012-02-07 NOTE — Progress Notes (Signed)
Marcia Davis July 04, 1966 [id number]   History:    46 y.o.  for annual exam who was complaining of tiredness fatigue lack of energy. She does have history of depression and anxiety for which she has been doing well on Cymbalta. She has past history vitamin D deficiency. She was complaining of irritation underneath her breast and groin region. She has had history in the past of intertrigo. Her husband has had a vasectomy. Patient's having normal menstrual cycle. Patient's had an endometrial biopsy in the past. Patient takes inhaler with prednisone on a when necessary basis. She had normal bone density study baseline in 2009. Review of her record indicated she was weighing 226 pounds down to 216. She started to be better and exercising regularly. She scheduled for mammogram next week and does her monthly self breast examination. Her last normal Pap smear was in 2012.  Past medical history,surgical history, family history and social history were all reviewed and documented in the EPIC chart.  Gynecologic History Patient's last menstrual period was 01/21/2012. Contraception: vasectomy Last Pap: 2012. Results were: normal Last mammogram: 2012. Results were: normal  Obstetric History OB History    Grav Para Term Preterm Abortions TAB SAB Ect Mult Living   1 1 1       1      # Outc Date GA Lbr Len/2nd Wgt Sex Del Anes PTL Lv   1 TRM     M SVD  No Yes       ROS:  Was performed and pertinent positives and negatives are included in the history.  Exam: chaperone present  BP 118/74  Ht 5' 2.25" (1.581 m)  Wt 216 lb (97.977 kg)  BMI 39.19 kg/m2  LMP 01/21/2012  Body mass index is 39.19 kg/(m^2).  General appearance : Well developed well nourished female. No acute distress HEENT: Neck supple, trachea midline, no carotid bruits, no thyroidmegaly Lungs: Clear to auscultation, no rhonchi or wheezes, or rib retractions  Heart: Regular rate and rhythm, no murmurs or gallops Breast:Examined in  sitting and supine position were symmetrical in appearance, no palpable masses or tenderness,  no skin retraction, no nipple inversion, no nipple discharge, no skin discoloration, no axillary or supraclavicular lymphadenopathy, underneath both breasts are as evidence of intertrigo. Abdomen: no palpable masses or tenderness, no rebound or guarding Extremities: no edema or skin discoloration or tenderness  Pelvic:  Bartholin, Urethra, Skene Glands: Within normal limits             Vagina: No gross lesions or discharge  Cervix: No gross lesions or discharge  Uterus  anteverted, limited due to patient's abdominal girth Adnexa limited due to patient's abdominal girth and had some tenderness in the right adnexa  Anus and perineum  normal   Rectovaginal  normal sphincter tone without palpated masses or tenderness             Hemoccult not done rectal exam refused     Assessment/Plan:  46 y.o. female for annual exam limited pelvic exam due to patient's abdominal girth (BMI 39.19 kg per meter squared) and discomfort in the right lower quadrant. She will return back next week for an ultrasound for a more thorough assessment. Due to patient's tiredness and fatigue we'll check a comprehensive metabolic panel along with a TSH and vitamin D level as well as a CBC. New Pap smear screening guidelines were discussed and she will not need one this year She was encouraged to continue to do her exercise as well  as her monthly breast exams. She was reminded to followup with her mammogram in the next few weeks as well. For her intertrigo she will be prescribed mytrex cream to apply twice a day for the next 5-7 days when necessary.   Ok Edwards MD, 11:47 AM 02/07/2012

## 2012-02-08 LAB — URINALYSIS W MICROSCOPIC + REFLEX CULTURE
Bacteria, UA: NONE SEEN
Bilirubin Urine: NEGATIVE
Casts: NONE SEEN
Crystals: NONE SEEN
Glucose, UA: NEGATIVE mg/dL
Hgb urine dipstick: NEGATIVE
Ketones, ur: NEGATIVE mg/dL
Leukocytes, UA: NEGATIVE
Nitrite: NEGATIVE
Protein, ur: NEGATIVE mg/dL
Specific Gravity, Urine: 1.022 (ref 1.005–1.030)
Squamous Epithelial / LPF: NONE SEEN
Urobilinogen, UA: 0.2 mg/dL (ref 0.0–1.0)
pH: 7 (ref 5.0–8.0)

## 2012-02-08 LAB — TSH: TSH: 1.813 u[IU]/mL (ref 0.350–4.500)

## 2012-02-08 LAB — VITAMIN D 25 HYDROXY (VIT D DEFICIENCY, FRACTURES): Vit D, 25-Hydroxy: 32 ng/mL (ref 30–89)

## 2012-02-11 ENCOUNTER — Ambulatory Visit
Admission: RE | Admit: 2012-02-11 | Discharge: 2012-02-11 | Disposition: A | Discharge status: Home / Self Care | Payer: BC Managed Care – PPO | Source: Ambulatory Visit | Attending: Gynecology | Admitting: Gynecology

## 2012-02-11 DIAGNOSIS — Z1231 Encounter for screening mammogram for malignant neoplasm of breast: Secondary | ICD-10-CM

## 2012-02-14 ENCOUNTER — Other Ambulatory Visit: Payer: BC Managed Care – PPO

## 2012-02-14 ENCOUNTER — Ambulatory Visit: Payer: BC Managed Care – PPO | Admitting: Gynecology

## 2012-02-20 ENCOUNTER — Telehealth: Payer: Self-pay | Admitting: *Deleted

## 2012-02-20 NOTE — Telephone Encounter (Signed)
Pt left message requesting labs mailed to home, left message on voicemail medical release must be filled out order to do so.

## 2012-04-06 ENCOUNTER — Telehealth: Payer: Self-pay | Admitting: *Deleted

## 2012-04-06 DIAGNOSIS — F32A Depression, unspecified: Secondary | ICD-10-CM

## 2012-04-06 DIAGNOSIS — F329 Major depressive disorder, single episode, unspecified: Secondary | ICD-10-CM

## 2012-04-06 MED ORDER — DULOXETINE HCL 20 MG PO CPEP: 20.0000 mg | ORAL_CAPSULE | Freq: Every day | ORAL | Status: DC

## 2012-04-06 NOTE — Telephone Encounter (Signed)
Pt called requesting 90 day supply for cymbalta 20 mg tablets, rx sent.

## 2012-05-18 ENCOUNTER — Encounter: Payer: Self-pay | Admitting: Gynecology

## 2012-05-18 ENCOUNTER — Ambulatory Visit (INDEPENDENT_AMBULATORY_CARE_PROVIDER_SITE_OTHER): Payer: BC Managed Care – PPO | Admitting: Gynecology

## 2012-05-18 VITALS — BP 118/72

## 2012-05-18 DIAGNOSIS — N898 Other specified noninflammatory disorders of vagina: Secondary | ICD-10-CM

## 2012-05-18 DIAGNOSIS — B373 Candidiasis of vulva and vagina: Secondary | ICD-10-CM

## 2012-05-18 LAB — WET PREP FOR TRICH, YEAST, CLUE
Clue Cells Wet Prep HPF POC: NONE SEEN
Trich, Wet Prep: NONE SEEN

## 2012-05-18 MED ORDER — FLUCONAZOLE 100 MG PO TABS: ORAL_TABLET | ORAL | Status: DC

## 2012-05-18 MED ORDER — TERCONAZOLE 0.8 % VA CREA: 1.0000 | TOPICAL_CREAM | Freq: Every day | VAGINAL | Status: AC

## 2012-05-18 NOTE — Patient Instructions (Addendum)
Candidal Vulvovaginitis Candidal vulvovaginitis is an infection of the vagina and vulva. The vulva is the skin around the opening of the vagina. This may cause itching and discomfort in and around the vagina.  HOME CARE  Only take medicine as told by your doctor.   Do not have sex (intercourse) until the infection is healed or as told by your doctor.   Practice safe sex.   Tell your sex partner about your infection.   Do not douche or use tampons.   Wear cotton underwear. Do not wear tight pants or panty hose.   Eat yogurt. This may help treat and prevent yeast infections.  GET HELP RIGHT AWAY IF:   You have a fever.   Your problems get worse during treatment or do not get better in 3 days.   You have discomfort, irritation, or itching in your vagina or vulva area.   You have pain after sex.   You start to get belly (abdominal) pain.  MAKE SURE YOU:  Understand these instructions.   Will watch your condition.   Will get help right away if you are not doing well or get worse.  Document Released: 12/27/2008 Document Revised: 09/19/2011 Document Reviewed: 12/27/2008 [facility] Patient Information 2012 [place], [place].

## 2012-05-18 NOTE — Progress Notes (Signed)
Patient presented to the office today complaining of vaginal discharge which started a few days ago in which initially it had a yellow color to it but no odor. She had some Cleocin vaginal cream and home and used for 3 days but still continues with the pruritus. Patient monogamous relationship.  Exam: Bartholin urethra Skene was within normal limits Vagina: White discharge was noted Cervix: No lesion discharge Uterus: Not examined Adnexa: Not examined Rectal: Not examined  Wet prep demonstrated moderate amount of yeast few bacteria few white blood cells  Assessment/plan: Patient with moniliasis will be given a prescription for Terazol 3 to apply intravaginally for 3 days and patient requested also one tablet of Diflucan which was also prescribed as well of 150 mg.

## 2012-05-25 ENCOUNTER — Telehealth: Payer: Self-pay | Admitting: *Deleted

## 2012-05-25 MED ORDER — TERCONAZOLE 0.4 % VA CREA: 1.0000 | TOPICAL_CREAM | Freq: Every day | VAGINAL | Status: AC

## 2012-05-25 NOTE — Telephone Encounter (Signed)
Tell patient she can use the Terazol for 1 more week if she has any left if not we can call it in or Diflucan 1 po every other day for three days.

## 2012-05-25 NOTE — Telephone Encounter (Signed)
Pt said she will try cream 7 day Terazol sent to pharmacy.

## 2012-05-25 NOTE — Telephone Encounter (Signed)
Pt calling c/o yeast infection still there from OV 05/18/12. 80% better took 4 day dose of the Terazol 3 day cream and took all the diflucan. No burning, no discharge only itching. Pt said she also used a boric acid left from old prescription. Itching isnt extremely bad, but pt is worried that it may get worse again. Please advise

## 2013-02-24 ENCOUNTER — Other Ambulatory Visit: Payer: Self-pay | Admitting: Gynecology

## 2013-02-24 ENCOUNTER — Other Ambulatory Visit: Payer: Self-pay

## 2013-02-24 DIAGNOSIS — Z1231 Encounter for screening mammogram for malignant neoplasm of breast: Secondary | ICD-10-CM

## 2013-02-24 NOTE — Telephone Encounter (Signed)
Will be called to schedule annual exam KW

## 2013-03-16 ENCOUNTER — Ambulatory Visit
Admission: RE | Admit: 2013-03-16 | Discharge: 2013-03-16 | Disposition: A | Discharge status: Home / Self Care | Payer: BC Managed Care – PPO | Source: Ambulatory Visit

## 2013-03-16 DIAGNOSIS — Z1231 Encounter for screening mammogram for malignant neoplasm of breast: Secondary | ICD-10-CM

## 2013-03-23 ENCOUNTER — Encounter: Payer: Self-pay | Admitting: Gynecology

## 2013-03-29 ENCOUNTER — Telehealth: Payer: Self-pay | Admitting: *Deleted

## 2013-03-29 DIAGNOSIS — Z01419 Encounter for gynecological examination (general) (routine) without abnormal findings: Secondary | ICD-10-CM

## 2013-03-29 NOTE — Telephone Encounter (Signed)
Pt has annual scheduled on 04/15/13 @ 10:00am pt would like to come in prior to have labs drawn. Please advise

## 2013-03-29 NOTE — Telephone Encounter (Signed)
Patient to have the following labs drawn before office visit for her annual exam: Fasting lipid profile, comprehensive metabolic panel, CBC, TSH and urinalysis

## 2013-03-29 NOTE — Telephone Encounter (Signed)
ORDERS PLACED, LEFT ON PT VOICEMAIL TO MAKE LAB APPOINTMENT.

## 2013-03-31 ENCOUNTER — Encounter: Payer: Self-pay | Admitting: Gynecology

## 2013-04-13 ENCOUNTER — Other Ambulatory Visit: Payer: BC Managed Care – PPO

## 2013-04-13 DIAGNOSIS — Z01419 Encounter for gynecological examination (general) (routine) without abnormal findings: Secondary | ICD-10-CM

## 2013-04-13 LAB — LIPID PANEL
Cholesterol: 167 mg/dL (ref 0–200)
HDL: 43 mg/dL (ref >39)
LDL Cholesterol: 115 mg/dL — ABNORMAL HIGH (ref 0–99)
Total CHOL/HDL Ratio: 3.9 Ratio
Triglycerides: 44 mg/dL (ref <150)
VLDL: 9 mg/dL (ref 0–40)

## 2013-04-13 LAB — COMPREHENSIVE METABOLIC PANEL
ALT: 11 U/L (ref 0–35)
AST: 14 U/L (ref 0–37)
Albumin: 4.1 g/dL (ref 3.5–5.2)
Alkaline Phosphatase: 68 U/L (ref 39–117)
BUN: 15 mg/dL (ref 6–23)
CO2: 24 mEq/L (ref 19–32)
Calcium: 8.9 mg/dL (ref 8.4–10.5)
Chloride: 107 mEq/L (ref 96–112)
Creat: 0.63 mg/dL (ref 0.50–1.10)
Glucose, Bld: 95 mg/dL (ref 70–99)
Potassium: 4.3 mEq/L (ref 3.5–5.3)
Sodium: 137 mEq/L (ref 135–145)
Total Bilirubin: 0.3 mg/dL (ref 0.3–1.2)
Total Protein: 6.5 g/dL (ref 6.0–8.3)

## 2013-04-13 LAB — CBC
HCT: 37.1 % (ref 36.0–46.0)
Hemoglobin: 12.3 g/dL (ref 12.0–15.0)
MCH: 27.3 pg (ref 26.0–34.0)
MCHC: 33.2 g/dL (ref 30.0–36.0)
MCV: 82.4 fL (ref 78.0–100.0)
Platelets: 205 10*3/uL (ref 150–400)
RBC: 4.5 MIL/uL (ref 3.87–5.11)
RDW: 14.9 % (ref 11.5–15.5)
WBC: 6 10*3/uL (ref 4.0–10.5)

## 2013-04-13 LAB — TSH: TSH: 1.493 u[IU]/mL (ref 0.350–4.500)

## 2013-04-15 ENCOUNTER — Other Ambulatory Visit (HOSPITAL_COMMUNITY)
Admission: RE | Admit: 2013-04-15 | Discharge: 2013-04-15 | Disposition: A | Discharge status: Home / Self Care | Payer: BC Managed Care – PPO | Source: Ambulatory Visit | Attending: Gynecology | Admitting: Gynecology

## 2013-04-15 ENCOUNTER — Encounter: Payer: Self-pay | Admitting: Gynecology

## 2013-04-15 ENCOUNTER — Ambulatory Visit (INDEPENDENT_AMBULATORY_CARE_PROVIDER_SITE_OTHER): Payer: BC Managed Care – PPO | Admitting: Gynecology

## 2013-04-15 VITALS — BP 130/74 | Ht 62.0 in | Wt 197.0 lb

## 2013-04-15 DIAGNOSIS — Z8639 Personal history of other endocrine, nutritional and metabolic disease: Secondary | ICD-10-CM | POA: Insufficient documentation

## 2013-04-15 DIAGNOSIS — F419 Anxiety disorder, unspecified: Secondary | ICD-10-CM

## 2013-04-15 DIAGNOSIS — Z23 Encounter for immunization: Secondary | ICD-10-CM

## 2013-04-15 DIAGNOSIS — F329 Major depressive disorder, single episode, unspecified: Secondary | ICD-10-CM

## 2013-04-15 DIAGNOSIS — Z01419 Encounter for gynecological examination (general) (routine) without abnormal findings: Secondary | ICD-10-CM | POA: Insufficient documentation

## 2013-04-15 DIAGNOSIS — Z1151 Encounter for screening for human papillomavirus (HPV): Secondary | ICD-10-CM | POA: Insufficient documentation

## 2013-04-15 DIAGNOSIS — F411 Generalized anxiety disorder: Secondary | ICD-10-CM

## 2013-04-15 DIAGNOSIS — N760 Acute vaginitis: Secondary | ICD-10-CM

## 2013-04-15 DIAGNOSIS — F32A Depression, unspecified: Secondary | ICD-10-CM

## 2013-04-15 MED ORDER — NAPROXEN 500 MG PO TABS: 500.0000 mg | ORAL_TABLET | Freq: Three times a day (TID) | ORAL | Status: DC

## 2013-04-15 MED ORDER — ESTRADIOL 0.1 MG/GM VA CREA: TOPICAL_CREAM | VAGINAL | Status: DC

## 2013-04-15 MED ORDER — DULOXETINE HCL 20 MG PO CPEP: 20.0000 mg | ORAL_CAPSULE | Freq: Every day | ORAL | Status: DC

## 2013-04-15 NOTE — Addendum Note (Signed)
Addended by: Richardson Chiquito on: 04/15/2013 12:46 PM   Modules accepted: Orders

## 2013-04-15 NOTE — Progress Notes (Signed)
Marcia Davis 09-16-1966 [id number]   History:    47 y.o.  for annual gyn exam When no complaints today. The patient was very happy in cheerful today. She has done well with Cymbalta 20 mg daily. Patient has been exercising and eating healthier. Review of her record as indicated that over the past 3 years she went from 226 pounds to current weight of 197 pounds . Patient denies any prior history of abnormal Pap smears. Her husband had a vasectomy. Patient with past history of endometrial ablation has done well cycles are much lighter than before. She does take Naprosyn 500 mg 3 times a day for dysmenorrhea when necessary. Recent mammogram demonstrated dense breast and she was evaluated with 3-dimensional mammogram report to be normal. Patient does her monthly breast exams. Patient does have past history vitamin D deficiency. Patient is not receive the Tdap vaccine as of yet.  Past medical history,surgical history, family history and social history were all reviewed and documented in the EPIC chart.  Gynecologic History Patient's last menstrual period was 04/01/2013. Contraception: vasectomy Last Pap: 2012. Results were: normal Last mammogram: 2014. Results were: normal  Obstetric History OB History   Grav Para Term Preterm Abortions TAB SAB Ect Mult Living   1 1 1       1      # Outc Date GA Lbr Len/2nd Wgt Sex Del Anes PTL Lv   1 TRM     M SVD  No Yes       ROS: A ROS was performed and pertinent positives and negatives are included in the history.  GENERAL: No fevers or chills. HEENT: No change in vision, no earache, sore throat or sinus congestion. NECK: No pain or stiffness. CARDIOVASCULAR: No chest pain or pressure. No palpitations. PULMONARY: No shortness of breath, cough or wheeze. GASTROINTESTINAL: No abdominal pain, nausea, vomiting or diarrhea, melena or bright red blood per rectum. GENITOURINARY: No urinary frequency, urgency, hesitancy or dysuria. MUSCULOSKELETAL: No joint or muscle  pain, no back pain, no recent trauma. DERMATOLOGIC: No rash, no itching, no lesions. ENDOCRINE: No polyuria, polydipsia, no heat or cold intolerance. No recent change in weight. HEMATOLOGICAL: No anemia or easy bruising or bleeding. NEUROLOGIC: No headache, seizures, numbness, tingling or weakness. PSYCHIATRIC: No depression, no loss of interest in normal activity or change in sleep pattern.     Exam: chaperone present  BP 130/74  Ht 5\' 2"  (1.575 m)  Wt 197 lb (89.359 kg)  BMI 36.02 kg/m2  LMP 04/01/2013  Body mass index is 36.02 kg/(m^2).  General appearance : Well developed well nourished female. No acute distress HEENT: Neck supple, trachea midline, no carotid bruits, no thyroidmegaly Lungs: Clear to auscultation, no rhonchi or wheezes, or rib retractions  Heart: Regular rate and rhythm, no murmurs or gallops Breast:Examined in sitting and supine position were symmetrical in appearance, no palpable masses or tenderness,  no skin retraction, no nipple inversion, no nipple discharge, no skin discoloration, no axillary or supraclavicular lymphadenopathy Abdomen: no palpable masses or tenderness, no rebound or guarding Extremities: no edema or skin discoloration or tenderness  Pelvic:  Bartholin, Urethra, Skene Glands: Within normal limits             Vagina: No gross lesions or discharge  Cervix: No gross lesions or discharge  Uterus  anteverted, normal size, shape and consistency, non-tender and mobile  Adnexa  Without masses or tenderness  Anus and perineum  normal   Rectovaginal  normal sphincter tone without palpated  masses or tenderness             Hemoccult that indicated     Assessment/Plan:  47 y.o. female for annual exam has done well for depression and anxiety with her Cymbalta 20 mg daily for which prescription refill was provided. Her recent comprehensive metabolic panel, TSH were normal. Her lipid profile was normal with the exception of LDL slightly elevated at 115.  Patient continued to be healthy and exercise like she's going and we will check her lipid profile next year. Because of her past history vitamin D deficiency we will check her vitamin D level today. She received a Tdap vaccine today. Urinalysis result pending. Patient wishes to have her Pap smear done yearly despite guidelines so Pap smear was done today. She was reminded to do her monthly breast exam. We discussed importance of calcium and vitamin D for osteoporosis prevention. The patient had a baseline bone density study in 2012 which was normal.    Ok Edwards MD, 11:13 AM 04/15/2013

## 2013-04-15 NOTE — Patient Instructions (Addendum)

## 2013-04-16 LAB — URINALYSIS W MICROSCOPIC + REFLEX CULTURE
Bacteria, UA: NONE SEEN
Bilirubin Urine: NEGATIVE
Casts: NONE SEEN
Crystals: NONE SEEN
Glucose, UA: NEGATIVE mg/dL
Hgb urine dipstick: NEGATIVE
Leukocytes, UA: NEGATIVE
Nitrite: NEGATIVE
Protein, ur: NEGATIVE mg/dL
Specific Gravity, Urine: 1.007 (ref 1.005–1.030)
Squamous Epithelial / LPF: NONE SEEN
Urobilinogen, UA: 0.2 mg/dL (ref 0.0–1.0)
pH: 7 (ref 5.0–8.0)

## 2013-04-19 ENCOUNTER — Encounter: Payer: Self-pay | Admitting: Gynecology

## 2013-04-26 ENCOUNTER — Telehealth: Payer: Self-pay | Admitting: *Deleted

## 2013-04-26 MED ORDER — FLUCONAZOLE 150 MG PO TABS: 150.0000 mg | ORAL_TABLET | Freq: Once | ORAL | Status: DC

## 2013-04-26 MED ORDER — TERCONAZOLE 0.4 % VA CREA: 1.0000 | TOPICAL_CREAM | Freq: Every day | VAGINAL | Status: DC

## 2013-04-26 NOTE — Telephone Encounter (Signed)
Pt informed with the below note, rx sent. 

## 2013-04-26 NOTE — Telephone Encounter (Signed)
Please call in Diflucan 150 mg #1. Terazol-7 apply qhs x 1 week.

## 2013-04-26 NOTE — Telephone Encounter (Signed)
Pt had annual on 04/15/13 she now has yeast infection itchy and white discharge on cycle now. She is having lots of irritation asked if you would be willing to give her something for this terrible yeast infection? She asked if pill could be given for now and then a cream for when her cycle stops. Please advise

## 2013-04-28 ENCOUNTER — Encounter: Payer: Self-pay | Admitting: Gynecology

## 2013-05-12 ENCOUNTER — Ambulatory Visit (INDEPENDENT_AMBULATORY_CARE_PROVIDER_SITE_OTHER): Payer: BC Managed Care – PPO | Admitting: General Surgery

## 2013-05-12 ENCOUNTER — Encounter (INDEPENDENT_AMBULATORY_CARE_PROVIDER_SITE_OTHER): Payer: Self-pay | Admitting: General Surgery

## 2013-05-12 VITALS — BP 100/60 | HR 74 | Resp 14 | Ht 62.0 in | Wt 197.4 lb

## 2013-05-12 DIAGNOSIS — R229 Localized swelling, mass and lump, unspecified: Secondary | ICD-10-CM

## 2013-05-12 DIAGNOSIS — R2242 Localized swelling, mass and lump, left lower limb: Secondary | ICD-10-CM

## 2013-05-12 NOTE — Patient Instructions (Signed)
We will order an ultrasound to evaluate your lower leg for a lipoma.

## 2013-05-12 NOTE — Progress Notes (Signed)
Chief Complaint  Patient presents with  . New Evaluation    eval lipoma on lt leg below knee    HISTORY:  Marcia Davis is a 47 y.o. female who presents to clinic with a mass in her lower left leg.  She states that she has been loosing weight intentionally for the last few months.  She has notice a mass on the medial aspect of her left lower leg which causes her pain occasionally.  She has a h/o lipoma removal on her left arm by her dermatologist.    Past Medical History  Diagnosis Date  . Asthma   . Anxiety   . Depression   . Migraine        Past Surgical History  Procedure Laterality Date  . Endometrial ablation      HER OPTION      Current Outpatient Prescriptions  Medication Sig Dispense Refill  . DULoxetine (CYMBALTA) 20 MG capsule Take 1 capsule (20 mg total) by mouth daily.  90 capsule  4  . estradiol (ESTRACE VAGINAL) 0.1 MG/GM vaginal cream Apply thin film to area twice a week  42.5 g  12  . Fluticasone-Salmeterol (ADVAIR) 500-50 MCG/DOSE AEPB Inhale 1 puff into the lungs every 12 (twelve) hours.        . Multiple Vitamin (MULTIVITAMIN) tablet Take 1 tablet by mouth daily.      . naproxen (NAPROSYN) 500 MG tablet Take 1 tablet (500 mg total) by mouth 3 (three) times daily with meals.  30 tablet  5   No current facility-administered medications for this visit.     Allergies  Allergen Reactions  . Codeine     REACTION: nausea      Family History  Problem Relation Age of Onset  . Diabetes Mother   . Hypertension Mother   . Heart disease Maternal Grandmother       History   Social History  . Marital Status: Married    Spouse Name: N/A    Number of Children: N/A  . Years of Education: N/A   Social History Main Topics  . Smoking status: Never Smoker   . Smokeless tobacco: Never Used  . Alcohol Use: No     Comment: HOLIDAYS  . Drug Use: No  . Sexually Active: Yes    Birth Control/ Protection: Other-see comments     Comment: PATIENTS PARTNER WITH  VASECTOMY   Other Topics Concern  . None   Social History Narrative  . None       REVIEW OF SYSTEMS - PERTINENT POSITIVES ONLY: Review of Systems - General ROS: negative for - chills or fever Hematological and Lymphatic ROS: negative for - bleeding problems or blood clots Respiratory ROS: no cough, shortness of breath, or wheezing Cardiovascular ROS: no chest pain or dyspnea on exertion Gastrointestinal ROS: no abdominal pain, change in bowel habits, or black or bloody stools  EXAM: Filed Vitals:   05/12/13 1516  BP: 100/60  Pulse: 74  Resp: 14    General appearance: alert and cooperative Resp: clear to auscultation bilaterally Cardio: regular rate and rhythm GI: soft, non-tender; bowel sounds normal; no masses,  no organomegaly There is a vague fullness over the anterio-medial portion of her left lower leg.  I do not feel any discrete masses.  She does have several small lipomatous appearing masses on her arms   ASSESSMENT AND PLAN: Marcia Davis is a 47 y.o. F who has recently lost weight and noticed several new subcutaneous masses.  There is one on her L leg that is really bothering here.  I do not definitively feel a true mass there, but since she is having pain, I will order an [place] to evaluate further.      [name], MD Colon and Rectal Surgery / General Surgery [facility]      Visit Diagnoses: 1. Lower leg mass, left     Primary Care Physician: Provider Not In System

## 2013-05-13 ENCOUNTER — Telehealth (INDEPENDENT_AMBULATORY_CARE_PROVIDER_SITE_OTHER): Payer: Self-pay | Admitting: *Deleted

## 2013-05-13 NOTE — Telephone Encounter (Signed)
I called and left the pt a message giving her the phone # to [facility] [phone].

## 2013-05-13 NOTE — Telephone Encounter (Signed)
Patient called this morning to ask for the number for where her [place] is scheduled so she can call and cancel it.  Patient states she doesn't want an [place] at this time but would not elaborate as to why.

## 2013-05-14 ENCOUNTER — Other Ambulatory Visit: Payer: BC Managed Care – PPO

## 2013-09-22 ENCOUNTER — Encounter: Payer: Self-pay | Admitting: Gynecology

## 2013-09-22 ENCOUNTER — Ambulatory Visit (INDEPENDENT_AMBULATORY_CARE_PROVIDER_SITE_OTHER): Payer: BC Managed Care – PPO | Admitting: Gynecology

## 2013-09-22 VITALS — BP 120/80

## 2013-09-22 DIAGNOSIS — R635 Abnormal weight gain: Secondary | ICD-10-CM

## 2013-09-22 DIAGNOSIS — F32A Depression, unspecified: Secondary | ICD-10-CM

## 2013-09-22 DIAGNOSIS — R5383 Other fatigue: Secondary | ICD-10-CM

## 2013-09-22 DIAGNOSIS — R6882 Decreased libido: Secondary | ICD-10-CM

## 2013-09-22 DIAGNOSIS — IMO0001 Reserved for inherently not codable concepts without codable children: Secondary | ICD-10-CM

## 2013-09-22 DIAGNOSIS — G4700 Insomnia, unspecified: Secondary | ICD-10-CM

## 2013-09-22 DIAGNOSIS — R5381 Other malaise: Secondary | ICD-10-CM

## 2013-09-22 DIAGNOSIS — F329 Major depressive disorder, single episode, unspecified: Secondary | ICD-10-CM

## 2013-09-22 DIAGNOSIS — R61 Generalized hyperhidrosis: Secondary | ICD-10-CM

## 2013-09-22 LAB — COMPREHENSIVE METABOLIC PANEL
ALT: 13 U/L (ref 0–35)
AST: 13 U/L (ref 0–37)
Albumin: 4.6 g/dL (ref 3.5–5.2)
Alkaline Phosphatase: 62 U/L (ref 39–117)
BUN: 17 mg/dL (ref 6–23)
CO2: 30 mEq/L (ref 19–32)
Calcium: 9.5 mg/dL (ref 8.4–10.5)
Chloride: 102 mEq/L (ref 96–112)
Creat: 0.68 mg/dL (ref 0.50–1.10)
Glucose, Bld: 79 mg/dL (ref 70–99)
Potassium: 3.9 mEq/L (ref 3.5–5.3)
Sodium: 139 mEq/L (ref 135–145)
Total Bilirubin: 0.3 mg/dL (ref 0.3–1.2)
Total Protein: 7.2 g/dL (ref 6.0–8.3)

## 2013-09-22 LAB — CBC WITH DIFFERENTIAL/PLATELET
Basophils Absolute: 0 10*3/uL (ref 0.0–0.1)
Basophils Relative: 0 % (ref 0–1)
Eosinophils Absolute: 0.2 10*3/uL (ref 0.0–0.7)
Eosinophils Relative: 3 % (ref 0–5)
HCT: 36.1 % (ref 36.0–46.0)
Hemoglobin: 11.9 g/dL — ABNORMAL LOW (ref 12.0–15.0)
Lymphocytes Relative: 43 % (ref 12–46)
Lymphs Abs: 2.6 10*3/uL (ref 0.7–4.0)
MCH: 26.5 pg (ref 26.0–34.0)
MCHC: 33 g/dL (ref 30.0–36.0)
MCV: 80.4 fL (ref 78.0–100.0)
Monocytes Absolute: 0.5 10*3/uL (ref 0.1–1.0)
Monocytes Relative: 8 % (ref 3–12)
Neutro Abs: 2.8 10*3/uL (ref 1.7–7.7)
Neutrophils Relative %: 46 % (ref 43–77)
Platelets: 225 10*3/uL (ref 150–400)
RBC: 4.49 MIL/uL (ref 3.87–5.11)
RDW: 14.9 % (ref 11.5–15.5)
WBC: 6 10*3/uL (ref 4.0–10.5)

## 2013-09-22 LAB — HEMOGLOBIN A1C
Hgb A1c MFr Bld: 5.8 % — ABNORMAL HIGH (ref <5.7)
Mean Plasma Glucose: 120 mg/dL — ABNORMAL HIGH (ref <117)

## 2013-09-22 NOTE — Progress Notes (Signed)
   The patient is a 47 year old who presented to the office today with multiple complaints as follows: patient for the past month and a half has been complaining of tiredness and insomnia and occasionally will have nocturia. She has had issues with temperature instability sometimes she sweating sometimes she feels cold especially right during her menses and shortly thereafter. She has had headaches on and off as well as dizziness has complained of weight gain and decreased libido. Her husband has had a vasectomy and her cycles are regular every 21-28 days which last for approximately 5-6 days.  Review of her records indicated she has been on Cymbalta 20 mg for over 5 years for depression. She was seen in July of this year and was doing well with no complaints at all her lab work were normal. The patient states that her family and work situation is fine. She is not seeing any other provider at the present time.  Exam: Well-developed well-nourished female slightly overweight appeared somewhat anxious but not depressed. HEENT: Unremarkable Lungs: Clear to auscultation Rogers or wheezes Heart: Regular rate and rhythm no murmurs or gallops Skin: No discoloration  Assessment/plan: Patient's multitude of symptoms could be related to various etiologies such as hormone imbalance, diabetes, metabolic disorder or as a result of long-term use of Cymbalta . The patient has stated that she has tried to come off the Cymbalta in the past and she cannot go more than 24 hours without it because she becomes symptomatic. I am going to contact one of my psychiatry colleagues to see how we can transition her or perhaps overlapping with another mood stabilizer did not take her off of Cymbalta cold [place]. A lot of this is symptoms that she is having could be secondary affects from the Cymbalta as I have shared with the patient. The following labs were B. Or colon comprehensive metabolic panel, CBC, hemoglobin A1c, TSH, and  FSH. We did discuss testing her for HIV or hepatitis. She states that she is in a monogamous relationship and was tested before she got married for HIV was negative. She's not engage in any risky behavior and she is in an administrative job description. We will contact her we have the lab results and I have spoken with the psychiatrist for their recommendation on transitioning patient all Cymbalta if all her lab tests are normal.

## 2013-09-22 NOTE — Patient Instructions (Signed)
Perimenopause Perimenopause is the time when your body begins to move into the menopause (no menstrual period for 12 straight months). It is a natural process. Perimenopause can begin 2 to 8 years before the menopause and usually lasts for one year after the menopause. During this time, your ovaries may or may not produce an egg. The ovaries vary in their production of estrogen and progesterone hormones each month. This can cause irregular menstrual periods, difficulty in getting pregnant, vaginal bleeding between periods and uncomfortable symptoms. CAUSES  Irregular production of the ovarian hormones, estrogen and progesterone, and not ovulating every month.  Other causes include:  Tumor of the pituitary gland in the brain.  Medical disease that affects the ovaries.  Radiation treatment.  Chemotherapy.  Unknown causes.  Heavy smoking and excessive alcohol intake can bring on perimenopause sooner. SYMPTOMS   Hot flashes.  Night sweats.  Irregular menstrual periods.  Decrease sex drive.  Vaginal dryness.  Headaches.  Mood swings.  Depression.  Memory problems.  Irritability.  Tiredness.  Weight gain.  Trouble getting pregnant.  The beginning of losing bone cells (osteoporosis).  The beginning of hardening of the arteries (atherosclerosis). DIAGNOSIS  Your caregiver will make a diagnosis by analyzing your age, menstrual history and your symptoms. They will do a physical exam noting any changes in your body, especially your female organs. Female hormone tests may or may not be helpful depending on the amount and when you produce the female hormones. However, other hormone tests may be helpful (ex. thyroid hormone) to rule out other problems. TREATMENT  The decision to treat during the perimenopause should be made by you and your caregiver depending on how the symptoms are affecting you and your life style. There are various treatments available such as:  Treating  individual symptoms with a specific medication for that symptom (ex. tranquilizer for depression).  Herbal medications that can help specific symptoms.  Counseling.  Group therapy.  No treatment. HOME CARE INSTRUCTIONS   Before seeing your caregiver, make a list of your menstrual periods (when the occur, how heavy they are, how long between periods and how long they last), your symptoms and when they started.  Take the medication as recommended by your caregiver.  Sleep and rest.  Exercise.  Eat a diet that contains calcium (good for your bones) and soy (acts like estrogen hormone).  Do not smoke.  Avoid alcoholic beverages.  Taking vitamin E may help in certain cases.  Take calcium and vitamin D supplements to help prevent bone loss.  Group therapy is sometimes helpful.  Acupuncture may help in some cases. SEEK MEDICAL CARE IF:   You have any of the above and want to know if it is perimenopause.  You want advice and treatment for any of your symptoms mentioned above.  You need a referral to a specialist (gynecologist, psychiatrist or psychologist). SEEK IMMEDIATE MEDICAL CARE IF:   You have vaginal bleeding.  Your period lasts longer than 8 days.  You periods are recurring sooner than 21 days.  You have bleeding after intercourse.  You have severe depression.  You have pain when you urinate.  You have severe headaches.  You develop vision problems. Document Released: 11/07/2004 Document Revised: 12/23/2011 Document Reviewed: 04/29/2013 [facility] Patient Information 2014 [place], [place].

## 2013-09-23 ENCOUNTER — Other Ambulatory Visit: Payer: Self-pay | Admitting: Gynecology

## 2013-09-23 DIAGNOSIS — R7309 Other abnormal glucose: Secondary | ICD-10-CM

## 2013-09-23 LAB — TESTOSTERONE: Testosterone: 32 ng/dL (ref 10–70)

## 2013-09-23 LAB — TSH: TSH: 1.836 u[IU]/mL (ref 0.350–4.500)

## 2013-09-23 LAB — FOLLICLE STIMULATING HORMONE: FSH: 17.5 m[IU]/mL

## 2013-09-28 ENCOUNTER — Telehealth: Payer: Self-pay | Admitting: *Deleted

## 2013-09-28 MED ORDER — FLUOXETINE HCL 20 MG PO CAPS: 20.0000 mg | ORAL_CAPSULE | Freq: Every day | ORAL | Status: DC

## 2013-09-28 NOTE — Telephone Encounter (Signed)
Pt informed with the below note. 

## 2013-09-28 NOTE — Telephone Encounter (Signed)
Just to clarify for 4 days pt will take both Prozac & cymbalta, stop cymbalta, then take Prozac 20 mg x 1 week only? Does pt continue prozac if symptoms ?  She also asked if taking both medication will make her like a "zombie" please advise

## 2013-09-28 NOTE — Telephone Encounter (Signed)
Message copied by Aura Camps on Tue Sep 28, 2013  3:49 PM ------      Message from: Ok Edwards      Created: Tue Sep 28, 2013  1:53 PM       Olegario Messier, please call this patient and informed her that I have spoken with psychiatrist Dr. Andee Poles in reference to transitioning her of Cymbalta. We are going to do it in the following fashion and if you can call in a prescription:            She is to begin taking Prozac 20 mg daily along with her current 20 mg of Cymbalta. After 4 days of being on the Prozac she can stop the Cymbalta and didn't take the Prozac 20 mg for 1 week only. Then we will monitor her symptoms. Prescribed Prozac 20 mg 11 tablets.            Thank you ------

## 2013-09-28 NOTE — Telephone Encounter (Signed)
Correct as it is written

## 2014-03-28 ENCOUNTER — Other Ambulatory Visit: Payer: Self-pay

## 2014-03-28 ENCOUNTER — Telehealth: Payer: Self-pay | Admitting: *Deleted

## 2014-03-28 DIAGNOSIS — Z01419 Encounter for gynecological examination (general) (routine) without abnormal findings: Secondary | ICD-10-CM

## 2014-03-28 DIAGNOSIS — Z1231 Encounter for screening mammogram for malignant neoplasm of breast: Secondary | ICD-10-CM

## 2014-03-28 NOTE — Telephone Encounter (Signed)
Pt has annual scheduled on 05/17/14 would like to have labs drawn prior to annual.  Please advise

## 2014-03-28 NOTE — Telephone Encounter (Signed)
CBC, FLP, TSH, U/A, CMET

## 2014-03-28 NOTE — Telephone Encounter (Signed)
Pt informed, orders placed.  

## 2014-03-30 ENCOUNTER — Ambulatory Visit
Admission: RE | Admit: 2014-03-30 | Discharge: 2014-03-30 | Disposition: A | Discharge status: Home / Self Care | Payer: BC Managed Care – PPO | Source: Ambulatory Visit

## 2014-03-30 DIAGNOSIS — Z1231 Encounter for screening mammogram for malignant neoplasm of breast: Secondary | ICD-10-CM

## 2014-04-11 ENCOUNTER — Telehealth: Payer: Self-pay | Admitting: *Deleted

## 2014-04-11 NOTE — Telephone Encounter (Signed)
Pt is taking a vacation in September would like to discuss birth control pill with Dr.Fernandez to help delay cycle, pt will make OV with JF to discuss.

## 2014-04-12 ENCOUNTER — Other Ambulatory Visit: Payer: BC Managed Care – PPO

## 2014-04-21 ENCOUNTER — Ambulatory Visit (INDEPENDENT_AMBULATORY_CARE_PROVIDER_SITE_OTHER): Payer: BC Managed Care – PPO | Admitting: Women's Health

## 2014-04-21 DIAGNOSIS — Z3009 Encounter for other general counseling and advice on contraception: Secondary | ICD-10-CM

## 2014-04-21 DIAGNOSIS — Z30011 Encounter for initial prescription of contraceptive pills: Secondary | ICD-10-CM

## 2014-04-21 MED ORDER — NORETHIN ACE-ETH ESTRAD-FE 1-20 MG-MCG PO TABS: 1.0000 | ORAL_TABLET | Freq: Every day | ORAL | Status: DC

## 2014-04-21 NOTE — Progress Notes (Signed)
Patient ID: Jinny SandersMaritza Davis, female   DOB: 1965-10-18, 48 y.o.   MRN: [id number]  Presents wanting to delay cycle for upcoming vacation September 5th-12th. Regular monthly cycle, next period due 7/14. Nonsmoker, history of normal blood pressure she used in the past without problem. Contraceptions vasectomy..   Exam: Appears well  Cycle management   Plan: Options reviewed, pills, patches, NuvaRing. Loestrin 1/20 prescription, proper use, slight risk of blood clots and strokes reviewed. Instructed to begin pills on first day of next cycle and take continuously for 6 weeks. If spotting occurs, stop pills for 4 days, then resume.

## 2014-05-17 ENCOUNTER — Ambulatory Visit (INDEPENDENT_AMBULATORY_CARE_PROVIDER_SITE_OTHER): Payer: BC Managed Care – PPO | Admitting: Gynecology

## 2014-05-17 ENCOUNTER — Encounter: Payer: Self-pay | Admitting: Gynecology

## 2014-05-17 ENCOUNTER — Other Ambulatory Visit (HOSPITAL_COMMUNITY)
Admission: RE | Admit: 2014-05-17 | Discharge: 2014-05-17 | Disposition: A | Discharge status: Home / Self Care | Payer: BC Managed Care – PPO | Source: Ambulatory Visit | Attending: Gynecology | Admitting: Gynecology

## 2014-05-17 VITALS — BP 124/82 | Ht 62.0 in | Wt 187.0 lb

## 2014-05-17 DIAGNOSIS — Z01419 Encounter for gynecological examination (general) (routine) without abnormal findings: Secondary | ICD-10-CM | POA: Insufficient documentation

## 2014-05-17 DIAGNOSIS — L292 Pruritus vulvae: Secondary | ICD-10-CM

## 2014-05-17 DIAGNOSIS — L293 Anogenital pruritus, unspecified: Secondary | ICD-10-CM

## 2014-05-17 DIAGNOSIS — Z8639 Personal history of other endocrine, nutritional and metabolic disease: Secondary | ICD-10-CM

## 2014-05-17 LAB — COMPREHENSIVE METABOLIC PANEL
ALT: 11 U/L (ref 0–35)
AST: 11 U/L (ref 0–37)
Albumin: 4.2 g/dL (ref 3.5–5.2)
Alkaline Phosphatase: 45 U/L (ref 39–117)
BUN: 12 mg/dL (ref 6–23)
CO2: 24 mEq/L (ref 19–32)
Calcium: 9 mg/dL (ref 8.4–10.5)
Chloride: 106 mEq/L (ref 96–112)
Creat: 0.74 mg/dL (ref 0.50–1.10)
Glucose, Bld: 90 mg/dL (ref 70–99)
Potassium: 4.2 mEq/L (ref 3.5–5.3)
Sodium: 139 mEq/L (ref 135–145)
Total Bilirubin: 0.4 mg/dL (ref 0.2–1.2)
Total Protein: 6.8 g/dL (ref 6.0–8.3)

## 2014-05-17 LAB — CBC WITH DIFFERENTIAL/PLATELET
Basophils Absolute: 0 10*3/uL (ref 0.0–0.1)
Basophils Relative: 0 % (ref 0–1)
Eosinophils Absolute: 0.1 10*3/uL (ref 0.0–0.7)
Eosinophils Relative: 2 % (ref 0–5)
HCT: 35.1 % — ABNORMAL LOW (ref 36.0–46.0)
Hemoglobin: 11.4 g/dL — ABNORMAL LOW (ref 12.0–15.0)
Lymphocytes Relative: 30 % (ref 12–46)
Lymphs Abs: 1.5 10*3/uL (ref 0.7–4.0)
MCH: 27 pg (ref 26.0–34.0)
MCHC: 32.5 g/dL (ref 30.0–36.0)
MCV: 83.2 fL (ref 78.0–100.0)
Monocytes Absolute: 0.3 10*3/uL (ref 0.1–1.0)
Monocytes Relative: 5 % (ref 3–12)
Neutro Abs: 3.2 10*3/uL (ref 1.7–7.7)
Neutrophils Relative %: 63 % (ref 43–77)
Platelets: 205 10*3/uL (ref 150–400)
RBC: 4.22 MIL/uL (ref 3.87–5.11)
RDW: 14.2 % (ref 11.5–15.5)
WBC: 5 10*3/uL (ref 4.0–10.5)

## 2014-05-17 LAB — LIPID PANEL
Cholesterol: 146 mg/dL (ref 0–200)
HDL: 44 mg/dL
LDL Cholesterol: 85 mg/dL (ref 0–99)
Total CHOL/HDL Ratio: 3.3 ratio
Triglycerides: 84 mg/dL
VLDL: 17 mg/dL (ref 0–40)

## 2014-05-17 LAB — VITAMIN D 25 HYDROXY (VIT D DEFICIENCY, FRACTURES): Vit D, 25-Hydroxy: 43 ng/mL (ref 30–89)

## 2014-05-17 LAB — TSH: TSH: 2.147 u[IU]/mL (ref 0.350–4.500)

## 2014-05-17 MED ORDER — ELETRIPTAN HYDROBROMIDE 20 MG PO TABS: 20.0000 mg | ORAL_TABLET | ORAL | Status: DC | PRN

## 2014-05-17 MED ORDER — FLUCONAZOLE 150 MG PO TABS: 150.0000 mg | ORAL_TABLET | Freq: Once | ORAL | Status: DC

## 2014-05-17 MED ORDER — NYSTATIN-TRIAMCINOLONE 100000-0.1 UNIT/GM-% EX CREA: 1.0000 "application " | TOPICAL_CREAM | Freq: Three times a day (TID) | CUTANEOUS | Status: DC

## 2014-05-17 NOTE — Progress Notes (Signed)
Marcia Davis 07/04/1966 [id number]   History:    48 y.o.  for annual gyn exam with no major complaints today. The patient husband has had a vasectomy. She was seen early this month by my nurse practitioner she wanted to go to low-dose oral contraceptive pill so that she would not have a menstrual cycle during her upcoming vacation and she will discontinue it. She is a nonsmoker. She has lost 10 pounds from last year exercising eating healthier. She is no longer taking Cymbalta. Patient with past history of endometrial ablation has cycles but very mild and comparison of what she experienced before. She has had history vitamin D deficiency in the past. She received her Tdap vaccine last year. She wanted a prescription for apparent so that she could take when necessary with migraines. Patient with no previous history of abnormal Pap smears.  Past medical history,surgical history, family history and social history were all reviewed and documented in the EPIC chart.  Gynecologic History Patient's last menstrual period was 04/26/2014. Contraception: vasectomy Last Pap: 2014. Results were: normal Last mammogram: 2015. Results were: Normal but dense  Obstetric History OB History  Gravida Para Term Preterm AB SAB TAB Ectopic Multiple Living  1 1 1       1     # Outcome Date GA Lbr Len/2nd Weight Sex Delivery Anes PTL Lv  1 TRM     M SVD  N Y       ROS: A ROS was performed and pertinent positives and negatives are included in the history.  GENERAL: No fevers or chills. HEENT: No change in vision, no earache, sore throat or sinus congestion. NECK: No pain or stiffness. CARDIOVASCULAR: No chest pain or pressure. No palpitations. PULMONARY: No shortness of breath, cough or wheeze. GASTROINTESTINAL: No abdominal pain, nausea, vomiting or diarrhea, melena or bright red blood per rectum. GENITOURINARY: No urinary frequency, urgency, hesitancy or dysuria. MUSCULOSKELETAL: No joint or muscle pain, no back  pain, no recent trauma. DERMATOLOGIC: No rash, no itching, no lesions. ENDOCRINE: No polyuria, polydipsia, no heat or cold intolerance. No recent change in weight. HEMATOLOGICAL: No anemia or easy bruising or bleeding. NEUROLOGIC: No headache, seizures, numbness, tingling or weakness. PSYCHIATRIC: No depression, no loss of interest in normal activity or change in sleep pattern.     Exam: chaperone present  BP 124/82  Ht 5\' 2"  (1.575 m)  Wt 187 lb (84.823 kg)  BMI 34.19 kg/m2  LMP 04/26/2014  Body mass index is 34.19 kg/(m^2).  General appearance : Well developed well nourished female. No acute distress HEENT: Neck supple, trachea midline, no carotid bruits, no thyroidmegaly Lungs: Clear to auscultation, no rhonchi or wheezes, or rib retractions  Heart: Regular rate and rhythm, no murmurs or gallops Breast:Examined in sitting and supine position were symmetrical in appearance, no palpable masses or tenderness,  no skin retraction, no nipple inversion, no nipple discharge, no skin discoloration, no axillary or supraclavicular lymphadenopathy Abdomen: no palpable masses or tenderness, no rebound or guarding Extremities: no edema or skin discoloration or tenderness  Pelvic:  Bartholin, Urethra, Skene Glands: Within normal limits             Vagina: No gross lesions or discharge  Cervix: No gross lesions or discharge  Uterus  anteverted, normal size, shape and consistency, non-tender and mobile  Adnexa  Without masses or tenderness  Anus and perineum  normal   Rectovaginal  normal sphincter tone without palpated masses or tenderness  Hemoccult not indicated     Assessment/Plan:  48 y.o. female for annual exam was instructed to continue her exercise and diet plan has been working well. We discussed the importance of monthly breast exam. The following labs were ordered today: CBC, comprehensive metabolic panel, TSH, fasting lipid profile, urinalysis and vitamin D level. Despite  the guidelines patient requested to have her Pap smear done today which was done. Prescription for Relpax 20 mg to take one by mouth at onset of migraine headache was provided. Also prescription for Diflucan 150 mg to take one by mouth on her trip if she develops a yeast infection. For her mons pubis pruritus and she excuses that time prescription for  Mytrex was provided for when necessary.  Note: This dictation was prepared with  Dragon/digital dictation along withSmart phrase technology. Any transcriptional errors that result from this process are unintentional.   Ok EdwardsFERNANDEZ,JUAN H MD, 9:12 AM 05/17/2014

## 2014-05-18 LAB — URINALYSIS W MICROSCOPIC + REFLEX CULTURE
Bacteria, UA: NONE SEEN
Bilirubin Urine: NEGATIVE
Casts: NONE SEEN
Crystals: NONE SEEN
Glucose, UA: NEGATIVE mg/dL
Hgb urine dipstick: NEGATIVE
Ketones, ur: NEGATIVE mg/dL
Leukocytes, UA: NEGATIVE
Nitrite: NEGATIVE
Protein, ur: NEGATIVE mg/dL
Specific Gravity, Urine: 1.022 (ref 1.005–1.030)
Urobilinogen, UA: 0.2 mg/dL (ref 0.0–1.0)
pH: 6 (ref 5.0–8.0)

## 2014-05-18 LAB — CYTOLOGY - PAP

## 2014-06-06 ENCOUNTER — Telehealth: Payer: Self-pay | Admitting: *Deleted

## 2014-06-06 MED ORDER — ELETRIPTAN HYDROBROMIDE 20 MG PO TABS: 20.0000 mg | ORAL_TABLET | ORAL | Status: DC | PRN

## 2014-06-06 NOTE — Telephone Encounter (Signed)
Pt called stating one of her Rx for OV on 05/17/14 relpax 20 mg was not at pharmacy. rx sent on print, I will call rx in to pharmacy for pt.

## 2014-06-14 ENCOUNTER — Other Ambulatory Visit: Payer: Self-pay | Admitting: Women's Health

## 2014-06-14 ENCOUNTER — Telehealth: Payer: Self-pay | Admitting: *Deleted

## 2014-06-14 DIAGNOSIS — Z30011 Encounter for initial prescription of contraceptive pills: Secondary | ICD-10-CM

## 2014-06-14 MED ORDER — NORETHIN ACE-ETH ESTRAD-FE 1-20 MG-MCG PO TABS: 1.0000 | ORAL_TABLET | Freq: Every day | ORAL | Status: DC

## 2014-06-14 NOTE — Telephone Encounter (Signed)
Telephone call, states had 2 days of light spotting questions if that was her cycle. Will start back on pills today take daily until after her vacation/cruise  and then stop. Husband vasectomy.

## 2014-06-14 NOTE — Telephone Encounter (Signed)
Pt saw you on OV 04/21/14 regarding delaying cycle to leaving on vacation Saturday. Pt asked if you would call her when you have a chance to take about OV. Call back # [phone]

## 2014-07-14 ENCOUNTER — Ambulatory Visit: Payer: BC Managed Care – PPO | Admitting: Gastroenterology

## 2014-08-15 ENCOUNTER — Encounter: Payer: Self-pay | Admitting: Gynecology

## 2015-04-24 ENCOUNTER — Other Ambulatory Visit: Payer: Self-pay

## 2015-04-24 ENCOUNTER — Telehealth: Payer: Self-pay | Admitting: *Deleted

## 2015-04-24 DIAGNOSIS — Z30011 Encounter for initial prescription of contraceptive pills: Secondary | ICD-10-CM

## 2015-04-24 DIAGNOSIS — Z1231 Encounter for screening mammogram for malignant neoplasm of breast: Secondary | ICD-10-CM

## 2015-04-24 MED ORDER — NORETHIN ACE-ETH ESTRAD-FE 1-20 MG-MCG PO TABS
1.0000 | ORAL_TABLET | Freq: Every day | ORAL | Status: DC
Start: 2015-04-24 — End: 2015-05-03

## 2015-04-24 NOTE — Telephone Encounter (Signed)
Pt will be leaving for trip in Sept. Would like to delay her cycle for this trip. Pt saw nancy on 04/21/14 and was given Rx for Junel Fe 1/20 #2 packs to begin pills on first day of next cycle. Pt asked if you could do the same?  Please advise

## 2015-04-24 NOTE — Telephone Encounter (Signed)
Pt informed, Rx sent for 3 packs.

## 2015-04-24 NOTE — Telephone Encounter (Signed)
Yes call in prescription refill for 3 packs with 4 refills

## 2015-04-27 ENCOUNTER — Ambulatory Visit
Admission: RE | Admit: 2015-04-27 | Discharge: 2015-04-27 | Disposition: A | Discharge status: Home / Self Care | Payer: BLUE CROSS/BLUE SHIELD | Source: Ambulatory Visit

## 2015-04-27 DIAGNOSIS — Z1231 Encounter for screening mammogram for malignant neoplasm of breast: Secondary | ICD-10-CM

## 2015-05-03 ENCOUNTER — Other Ambulatory Visit: Payer: Self-pay | Admitting: Women's Health

## 2015-05-03 ENCOUNTER — Telehealth: Payer: Self-pay | Admitting: *Deleted

## 2015-05-03 DIAGNOSIS — Z30011 Encounter for initial prescription of contraceptive pills: Secondary | ICD-10-CM

## 2015-05-03 MED ORDER — NORETHIN ACE-ETH ESTRAD-FE 1-20 MG-MCG PO TABS: 1.0000 | ORAL_TABLET | Freq: Every day | ORAL | Status: DC

## 2015-05-03 NOTE — Telephone Encounter (Signed)
Marcia Davis patient asked if you could call her at [phone] she has a quick question about OV on 04/21/14 visit with delaying her cycle. Please advise

## 2015-05-03 NOTE — Telephone Encounter (Signed)
Phone call, will take pills continuously until after vacation. Has is had normal cycles, will schedule annual exam.

## 2015-07-04 ENCOUNTER — Other Ambulatory Visit: Payer: Self-pay | Admitting: Gynecology

## 2015-08-15 ENCOUNTER — Ambulatory Visit: Payer: Self-pay | Admitting: Allergy and Immunology

## 2015-10-02 ENCOUNTER — Ambulatory Visit: Payer: BLUE CROSS/BLUE SHIELD | Admitting: Sports Medicine

## 2016-02-06 ENCOUNTER — Telehealth: Payer: Self-pay | Admitting: *Deleted

## 2016-02-06 ENCOUNTER — Ambulatory Visit (INDEPENDENT_AMBULATORY_CARE_PROVIDER_SITE_OTHER): Payer: BLUE CROSS/BLUE SHIELD | Admitting: Allergy and Immunology

## 2016-02-06 ENCOUNTER — Encounter: Payer: Self-pay | Admitting: Allergy and Immunology

## 2016-02-06 VITALS — BP 122/82 | HR 70 | Resp 22 | Ht 61.42 in | Wt 184.1 lb

## 2016-02-06 DIAGNOSIS — J4530 Mild persistent asthma, uncomplicated: Secondary | ICD-10-CM | POA: Diagnosis not present

## 2016-02-06 MED ORDER — MOMETASONE FUROATE 0.1 % EX CREA: TOPICAL_CREAM | CUTANEOUS | Status: DC

## 2016-02-06 MED ORDER — BUDESONIDE-FORMOTEROL FUMARATE 160-4.5 MCG/ACT IN AERO: INHALATION_SPRAY | RESPIRATORY_TRACT | Status: DC

## 2016-02-06 MED ORDER — ALBUTEROL SULFATE HFA 108 (90 BASE) MCG/ACT IN AERS: INHALATION_SPRAY | RESPIRATORY_TRACT | Status: DC

## 2016-02-06 MED ORDER — MONTELUKAST SODIUM 10 MG PO TABS: ORAL_TABLET | ORAL | Status: DC

## 2016-02-06 NOTE — Patient Instructions (Signed)
  1. Symbicort 160 - 2 inhalations one-2 times a day depending on disease activity  2. Montelukast 10 mg tablet 1 time per day  3. Ventolin HFA 2 puffs every 4-6 hours if needed  4. Mometasone 0.1% cream one time per day if needed  5. Return to clinic in 1 year or earlier if problem

## 2016-02-06 NOTE — Progress Notes (Signed)
Follow-up Note  Referring Provider: No ref. provider found Primary Provider: PROVIDER NOT IN SYSTEM Date of Office Visit: 02/06/2016  Subjective:   Marcia Davis (DOB: September 15, 1966) is a 50 y.o. female who [facility] in re-evaluation of the following:  HPI: Marcia Davis presents to this clinic in reevaluation of her asthma and allergic rhinitis and atopic dermatitis. I've not seen her in his clinic since April 2016.  Her asthma has been under excellent control while using Symbicort 160 2 inhalations one time per day. Rarely does she need to go up to twice a day. Rarely does she use a short acting bronchodilator. She can exercise without much difficulty. The only time she'll increase Symbicort to twice a day and reinitiate useof  montelukast is when she visits family who have animals. At that point in time she feels as though she does need added anti-inflammatory medication for her respiratory tract. Rarely does she need to use any topical mometasone. She uses this agent about 4 times per year. Her nose is not really been causing her much of a problem. She does not receive the flu vaccine.    Medication List           albuterol 108 (90 Base) MCG/ACT inhaler  Commonly known as:  VENTOLIN HFA  INHALE TWO PUFFS EVERY 4-6 HOURS IF NEEDED FOR COUGH OR WHEEZE     budesonide-formoterol 160-4.5 MCG/ACT inhaler  Commonly known as:  SYMBICORT  Inhale 2 puffs into the lungs daily.     COLLAGEN PO  Take by mouth.     montelukast 10 MG tablet  Commonly known as:  SINGULAIR  Take 10 mg by mouth daily as needed.     multivitamin tablet  Take 1 tablet by mouth daily.     VITAMIN D PO  Take by mouth.        Past Medical History  Diagnosis Date  . Asthma   . Anxiety   . Depression   . Migraine     Past Surgical History  Procedure Laterality Date  . Endometrial ablation      HER OPTION    Allergies  Allergen Reactions  . Codeine    REACTION: nausea    Review of systems negative except as noted in HPI / PMHx or noted below:  Review of Systems  Constitutional: Negative.   HENT: Negative.   Eyes: Negative.   Respiratory: Negative.   Cardiovascular: Negative.   Gastrointestinal: Negative.   Genitourinary: Negative.   Musculoskeletal: Negative.   Skin: Negative.   Neurological: Negative.   Endo/Heme/Allergies: Negative.   Psychiatric/Behavioral: Negative.      Objective:   Filed Vitals:   02/06/16 1026  BP: 122/82  Pulse: 70  Resp: 22   Height: 5' 1.42" (156 cm)  Weight: 184 lb 1.4 oz (83.5 kg)   Physical Exam  Constitutional: She is well-developed, well-nourished, and in no distress.  HENT:  Head: Normocephalic.  Right Ear: Tympanic membrane, external ear and ear canal normal.  Left Ear: Tympanic membrane, external ear and ear canal normal.  Nose: Nose normal. No mucosal edema or rhinorrhea.  Mouth/Throat: Uvula is midline, oropharynx is clear and moist and mucous membranes are normal. No oropharyngeal exudate.  Eyes: Conjunctivae are normal.  Neck: Trachea normal. No tracheal tenderness present. No tracheal deviation present. No thyromegaly present.  Cardiovascular: Normal rate, regular rhythm, S1 normal, S2 normal and normal heart sounds.   No murmur heard. Pulmonary/Chest:  Breath sounds normal. No stridor. No respiratory distress. She has no wheezes. She has no rales.  Musculoskeletal: She exhibits no edema.  Lymphadenopathy:       Head (right side): No tonsillar adenopathy present.       Head (left side): No tonsillar adenopathy present.    She has no cervical adenopathy.  Neurological: She is alert. Gait normal.  Skin: No rash noted. She is not diaphoretic. No erythema. Nails show no clubbing.  Psychiatric: Mood and affect normal.    Diagnostics:    Spirometry was performed and demonstrated an FEV1 of 2.42 at 95 % of predicted.  The patient had an Asthma Control Test with the  following results: ACT Total Score: 24.    Assessment and Plan:   1. Mild persistent asthma, uncomplicated     1. Symbicort 160 - 2 inhalations one-2 times a day depending on disease activity  2. Montelukast 10 mg tablet 1 time per day  3. Ventolin HFA 2 puffs every 4-6 hours if needed  4. Mometasone 0.1% cream one time per day if needed  5. Return to clinic in 1 year or earlier if problem  Marcia Davis appears to be doing quite well on her current medical plan and we'll keep her on this medical plan and see her back in this clinic approximately one year. She has a very good understanding of her disease state and when it is appropriate to use specific medications. She will contact me during the interval should there be a significant problem.  Laurette SchimkeEric Loucille Takach, MD [facility]

## 2016-02-06 NOTE — Telephone Encounter (Signed)
Insurance will not pay for Symbicort. Patient must try and fail Advair HFA, Advair Diskus, Breo 100/200 or Dulera 100/200. Please advise.

## 2016-02-07 ENCOUNTER — Telehealth: Payer: Self-pay | Admitting: Allergy and Immunology

## 2016-02-07 NOTE — Telephone Encounter (Signed)
Breo 200. Please inform patient.

## 2016-02-07 NOTE — Telephone Encounter (Signed)
Pt was returning a nurse phone call. [phone]

## 2016-02-07 NOTE — Telephone Encounter (Signed)
Pt wants a PA for symbicort as it is working [employer] nurse[employer]onderfully for her. Is it ok to proceed with the PA

## 2016-02-07 NOTE — Telephone Encounter (Signed)
Yes, provide PA and coupon for symbicort.

## 2016-02-07 NOTE — Telephone Encounter (Signed)
Lm for pt to call us back  

## 2016-02-12 NOTE — Telephone Encounter (Signed)
HAVE SENT PA TO [facility]ANE [facility] HEARD ANYTHING LAST I CHECKED

## 2016-02-12 NOTE — Telephone Encounter (Signed)
Pt is calling again to see what she is supposed to do about her inhaler.   Please Advise

## 2016-02-13 ENCOUNTER — Other Ambulatory Visit: Payer: Self-pay

## 2016-02-13 NOTE — Telephone Encounter (Signed)
Pa was denied for symbicort they are faxing over appeals form.

## 2016-02-13 NOTE — Telephone Encounter (Signed)
Called and lm for pt to call us back her pa was denied she will have to try breo

## 2016-02-16 ENCOUNTER — Other Ambulatory Visit: Payer: Self-pay

## 2016-02-16 MED ORDER — FLUTICASONE-SALMETEROL 115-21 MCG/ACT IN AERO: 2.0000 | INHALATION_SPRAY | Freq: Two times a day (BID) | RESPIRATORY_TRACT | Status: DC

## 2016-02-16 NOTE — Telephone Encounter (Signed)
Since pa was denied pt was wondering if she can go back on advair. Advair is preferred.

## 2016-02-16 NOTE — Telephone Encounter (Signed)
Please inform patient that she can use ADVAIR 115 two inhalations twice a day to replace symbicort

## 2016-02-16 NOTE — Telephone Encounter (Signed)
Spoke with it pt and sent in advair

## 2016-02-16 NOTE — Telephone Encounter (Signed)
Lm for pt to call us back, we need to verify her pharmacy to send in the advair 115 2 puffs bid

## 2016-02-16 NOTE — Telephone Encounter (Signed)
[facility] - Routing back to [facility]

## 2016-04-09 ENCOUNTER — Other Ambulatory Visit: Payer: Self-pay | Admitting: Gynecology

## 2016-04-09 DIAGNOSIS — Z1231 Encounter for screening mammogram for malignant neoplasm of breast: Secondary | ICD-10-CM

## 2016-04-29 ENCOUNTER — Ambulatory Visit
Admission: RE | Admit: 2016-04-29 | Discharge: 2016-04-29 | Disposition: A | Discharge status: Home / Self Care | Payer: BLUE CROSS/BLUE SHIELD | Source: Ambulatory Visit | Attending: Gynecology | Admitting: Gynecology

## 2016-04-29 DIAGNOSIS — Z1231 Encounter for screening mammogram for malignant neoplasm of breast: Secondary | ICD-10-CM

## 2016-06-21 ENCOUNTER — Encounter: Payer: Self-pay | Admitting: Physician Assistant

## 2016-06-21 ENCOUNTER — Ambulatory Visit (INDEPENDENT_AMBULATORY_CARE_PROVIDER_SITE_OTHER): Payer: BLUE CROSS/BLUE SHIELD | Admitting: Physician Assistant

## 2016-06-21 VITALS — BP 112/70 | HR 72 | Ht 61.75 in | Wt 190.0 lb

## 2016-06-21 DIAGNOSIS — R52 Pain, unspecified: Secondary | ICD-10-CM

## 2016-06-21 DIAGNOSIS — R1013 Epigastric pain: Secondary | ICD-10-CM | POA: Diagnosis not present

## 2016-06-21 DIAGNOSIS — K529 Noninfective gastroenteritis and colitis, unspecified: Secondary | ICD-10-CM | POA: Diagnosis not present

## 2016-06-21 DIAGNOSIS — R140 Abdominal distension (gaseous): Secondary | ICD-10-CM | POA: Diagnosis not present

## 2016-06-21 MED ORDER — HYOSCYAMINE SULFATE 0.125 MG SL SUBL: 0.1250 mg | SUBLINGUAL_TABLET | SUBLINGUAL | 1 refills | Status: DC | PRN

## 2016-06-21 NOTE — Progress Notes (Signed)
Subjective:    Patient ID: Marcia Davis, female    DOB: [birth year], 50 y.o.   MRN: [id number]  HPI Marcia Davis is a 50 year old female known remotely to Dr. Arlyce Dice. She was last seen here in 2012 and at that time diagnosed with gastritis. She did have upper abdominal ultrasound which was negative. She also had a very remote EGD in 2004, this was negative with the exception of a 5 mm polyp in the gastric cardia . She also had sigmoidoscopy at that same time which was negative. Patient comes in today with 3 week history of an acute illness was started after she ate a bunch of cherries. She says she developed abdominal pain and cramping and had an episode of incontinent diarrhea that night while sleeping. She says she continued to have some diarrhea over the next 2 weeks off and on which was liquid and urgent and nonbloody. She had no associated fever chills or sweats. The diarrhea has stopped as of the past week but she continues to have epigastric pain which then radiates into her lower abdomen after any by mouth intake. He describes this as soreness and cramping. She is frustrated by persistent symptoms and says she can't eat her regular diet which includes a lot of vegetables. She's having to eat very soft bland foods has everything else causes urgency and cramping. She's not on any regular aspirin or NSAIDs, she had not taken any recent antibiotics. She has been taking a natural supplement called Maca  which she is putting in her coffee that said she'd been taking that for a couple of months prior to onset of her symptoms. She says that she does not like to take medications and wants to have a definite diagnosis.  Review of Systems Pertinent positive and negative review of systems were noted in the above HPI section.  All other review of systems was otherwise negative.  Outpatient Encounter Prescriptions as of 06/21/2016  Medication Sig  . albuterol (VENTOLIN HFA) 108 (90 Base) MCG/ACT inhaler INHALE TWO  PUFFS EVERY 4-6 HOURS IF NEEDED FOR COUGH OR WHEEZE  . fluticasone-salmeterol (ADVAIR HFA) 115-21 MCG/ACT inhaler Inhale 2 puffs into the lungs 2 (two) times daily.  . Multiple Vitamin (MULTIVITAMIN) tablet Take 1 tablet by mouth daily.  . hyoscyamine (LEVSIN SL) 0.125 MG SL tablet Place 1 tablet (0.125 mg total) under the tongue every 4 (four) hours as needed. 10-15 mins before meals  . mometasone (ELOCON) 0.1 % cream APPLY TO AFFECTED AREAS ONCE DAILY IF NEEDED (Patient not taking: Reported on 06/21/2016)  . montelukast (SINGULAIR) 10 MG tablet TAKE ONE TABLET ONCE DAILY AS DIRECTED (Patient not taking: Reported on 06/21/2016)  . [DISCONTINUED] budesonide-formoterol (SYMBICORT) 160-4.5 MCG/ACT inhaler INHALE TWO PUFFS TWICE DAILY TO PREVENT COUGH OR WHEEZE  . [DISCONTINUED] Cholecalciferol (VITAMIN D PO) Take by mouth.  . [DISCONTINUED] COLLAGEN PO Take by mouth.   No facility-administered encounter medications on file as of 06/21/2016.    Allergies  Allergen Reactions  . Codeine     REACTION: nausea   Patient Active Problem List   Diagnosis Date Noted  . Vaginitis and vulvovaginitis 04/15/2013  . H/O vitamin D deficiency 04/15/2013  . Depression 02/07/2012  . Anxiety 02/07/2012  . Weight disorder 02/07/2012  . Candidiasis, intertrigo 06/12/2011  . Asthma   . Migraine   . DYSPEPSIA&OTHER SPEC DISORDERS FUNCTION STOMACH 05/22/2009  . SINUSITIS- ACUTE-NOS 08/19/2007  . ASTHMA, CHRONIC OBSTRUCTIVE NOS 04/06/2007  . HYPERTENSION 02/26/2007   Social History  Social History  . Marital status: Married    Spouse name: N/A  . Number of children: N/A  . Years of education: N/A   Occupational History  . Not on file.   Social History Main Topics  . Smoking status: Never Smoker  . Smokeless tobacco: Never Used  . Alcohol use No     Comment: HOLIDAYS  . Drug use: No  . Sexual activity: Yes    Birth control/ protection: Other-see comments     Comment: PATIENTS PARTNER WITH VASECTOMY     Other Topics Concern  . Not on file   Social History Narrative  . No narrative on file    Marcia Davis's family history includes Allergic rhinitis in her brother and son; Asthma in her brother and son; Diabetes in her mother; Heart disease in her maternal grandmother; Hypertension in her mother.      Objective:    Vitals:   06/21/16 1034  BP: 112/70  Pulse: 72    Physical Exam well-developed Hispanic female in no acute distress, pleasant blood pressure 112/70 pulse 72, BMI 35. HEENT; nontraumatic, cephalic EOMI PERRLA sclera anicteric, Cardiovascular; regular rate and rhythm with S1-S2 no murmur or gallop, Pulmonary ;clear bilaterally, Abdomen ;soft she has rather generalized abdominal mild tenderness is tender in the epigastrium there is no guarding or rebound no palpable mass or hepatosplenomegaly bowel sounds are present, Rectal; exam not done, Extremities ;no clubbing cyanosis or edema skin warm and dry, Neuropsych ;mood and affect appropriate       Assessment & Plan:   #281 50 year old female with 3 week history of acute illness with diarrhea and abdominal cramping at onset. Diarrhea persisted for about 2 weeks was not severe and has since subsided. She has continued postprandial epigastric pain which then radiates into her lower abdomen and associated cramping. I suspect she did have an acute infectious gastroenteritis which is slowly resolving. She may also have gastritis.  Plan; Diarrhea has subsided so stool studies will not be helpful. I asked her to call back should diarrhea recur Start Pepcid before meals 1 by mouth twice a day 2 weeks Start Levsin sublingual 10-15 minutes before each meal until symptoms improve Patient is requesting an endoscopy and will schedule for EGD with Dr. Lavon PaganiniNandigam in 3-4 weeks. Procedure discussed in detail with the patient and she is agreeable to proceed. I discussed with the patient that if her symptoms resolve in the interim she may cancel the  endoscopy. She will also restart her daily probiotic.  Marcia Davis S Dimitrios Balestrieri PA-C 06/21/2016   Cc: No ref. provider found

## 2016-06-21 NOTE — Patient Instructions (Addendum)
You have been scheduled for an endoscopy. Please follow written instructions given to you at your visit today. If you use inhalers (even only as needed), please bring them with you on the day of your procedure. Your physician has requested that you go to www.startemmi.com and enter the access code given to you at your visit today. This web site gives a general overview about your procedure. However, you should still follow specific instructions given to you by our office regarding your preparation for the procedure.   Pepcid AC before meals twice a day x 2 weeks.   Take Levsin 10-15 mins before meals.   Start daily probiotic with food.   Bland diet until feeling better.   Food Choices for Gastroesophageal Reflux Disease, Adult When you have gastroesophageal reflux disease (GERD), the foods you eat and your eating habits are very important. Choosing the right foods can help ease the discomfort of GERD. WHAT GENERAL GUIDELINES DO I NEED TO FOLLOW?  Choose fruits, vegetables, whole grains, low-fat dairy products, and low-fat meat, fish, and poultry.  Limit fats such as oils, salad dressings, butter, nuts, and avocado.  Keep a food diary to identify foods that cause symptoms.  Avoid foods that cause reflux. These may be different for different people.  Eat frequent small meals instead of three large meals each day.  Eat your meals slowly, in a relaxed setting.  Limit fried foods.  Cook foods using methods other than frying.  Avoid drinking alcohol.  Avoid drinking large amounts of liquids with your meals.  Avoid bending over or lying down until 2-3 hours after eating. WHAT FOODS ARE NOT RECOMMENDED? The following are some foods and drinks that may worsen your symptoms: Vegetables Tomatoes. Tomato juice. Tomato and spaghetti sauce. Chili peppers. Onion and garlic. Horseradish. Fruits Oranges, grapefruit, and lemon (fruit and juice). Meats High-fat meats, fish, and poultry.  This includes hot dogs, ribs, ham, sausage, salami, and bacon. Dairy Whole milk and chocolate milk. Sour cream. Cream. Butter. Ice cream. Cream cheese.  Beverages Coffee and tea, with or without caffeine. Carbonated beverages or energy drinks. Condiments Hot sauce. Barbecue sauce.  Sweets/Desserts Chocolate and cocoa. Donuts. Peppermint and spearmint. Fats and Oils High-fat foods, including [place] fries and potato chips. Other Vinegar. Strong spices, such as black pepper, white pepper, red pepper, cayenne, curry powder, cloves, ginger, and chili powder. The items listed above may not be a complete list of foods and beverages to avoid. Contact your dietitian for more information.   This information is not intended to replace advice given to you by your health care provider. Make sure you discuss any questions you have with your health care provider.   Document Released: 09/30/2005 Document Revised: 10/21/2014 Document Reviewed: 08/04/2013 Elsevier Interactive Patient Education [employer]2016 Elsevier Inc.

## 2016-06-27 NOTE — Progress Notes (Signed)
Reviewed and agree with documentation and assessment and plan. K. Veena Tiago Humphrey , MD   

## 2016-07-10 ENCOUNTER — Encounter: Payer: Self-pay | Admitting: Gastroenterology

## 2016-07-17 DIAGNOSIS — M5135 Other intervertebral disc degeneration, thoracolumbar region: Secondary | ICD-10-CM | POA: Diagnosis not present

## 2016-07-17 DIAGNOSIS — M5136 Other intervertebral disc degeneration, lumbar region: Secondary | ICD-10-CM | POA: Diagnosis not present

## 2016-07-17 DIAGNOSIS — M9903 Segmental and somatic dysfunction of lumbar region: Secondary | ICD-10-CM | POA: Diagnosis not present

## 2016-07-17 DIAGNOSIS — M5134 Other intervertebral disc degeneration, thoracic region: Secondary | ICD-10-CM | POA: Diagnosis not present

## 2016-07-24 ENCOUNTER — Encounter: Payer: BLUE CROSS/BLUE SHIELD | Admitting: Gastroenterology

## 2016-07-29 DIAGNOSIS — M5135 Other intervertebral disc degeneration, thoracolumbar region: Secondary | ICD-10-CM | POA: Diagnosis not present

## 2016-07-29 DIAGNOSIS — M9903 Segmental and somatic dysfunction of lumbar region: Secondary | ICD-10-CM | POA: Diagnosis not present

## 2016-07-29 DIAGNOSIS — M5136 Other intervertebral disc degeneration, lumbar region: Secondary | ICD-10-CM | POA: Diagnosis not present

## 2016-07-29 DIAGNOSIS — M5134 Other intervertebral disc degeneration, thoracic region: Secondary | ICD-10-CM | POA: Diagnosis not present

## 2016-07-31 DIAGNOSIS — M9903 Segmental and somatic dysfunction of lumbar region: Secondary | ICD-10-CM | POA: Diagnosis not present

## 2016-07-31 DIAGNOSIS — M5135 Other intervertebral disc degeneration, thoracolumbar region: Secondary | ICD-10-CM | POA: Diagnosis not present

## 2016-07-31 DIAGNOSIS — M5136 Other intervertebral disc degeneration, lumbar region: Secondary | ICD-10-CM | POA: Diagnosis not present

## 2016-07-31 DIAGNOSIS — M5134 Other intervertebral disc degeneration, thoracic region: Secondary | ICD-10-CM | POA: Diagnosis not present

## 2016-08-05 DIAGNOSIS — M5135 Other intervertebral disc degeneration, thoracolumbar region: Secondary | ICD-10-CM | POA: Diagnosis not present

## 2016-08-05 DIAGNOSIS — M5136 Other intervertebral disc degeneration, lumbar region: Secondary | ICD-10-CM | POA: Diagnosis not present

## 2016-08-05 DIAGNOSIS — M5134 Other intervertebral disc degeneration, thoracic region: Secondary | ICD-10-CM | POA: Diagnosis not present

## 2016-08-05 DIAGNOSIS — M9903 Segmental and somatic dysfunction of lumbar region: Secondary | ICD-10-CM | POA: Diagnosis not present

## 2016-08-19 DIAGNOSIS — M9903 Segmental and somatic dysfunction of lumbar region: Secondary | ICD-10-CM | POA: Diagnosis not present

## 2016-08-19 DIAGNOSIS — M5134 Other intervertebral disc degeneration, thoracic region: Secondary | ICD-10-CM | POA: Diagnosis not present

## 2016-08-19 DIAGNOSIS — M5136 Other intervertebral disc degeneration, lumbar region: Secondary | ICD-10-CM | POA: Diagnosis not present

## 2016-08-19 DIAGNOSIS — M5135 Other intervertebral disc degeneration, thoracolumbar region: Secondary | ICD-10-CM | POA: Diagnosis not present

## 2016-09-02 DIAGNOSIS — M5136 Other intervertebral disc degeneration, lumbar region: Secondary | ICD-10-CM | POA: Diagnosis not present

## 2016-09-02 DIAGNOSIS — M5135 Other intervertebral disc degeneration, thoracolumbar region: Secondary | ICD-10-CM | POA: Diagnosis not present

## 2016-09-02 DIAGNOSIS — M5134 Other intervertebral disc degeneration, thoracic region: Secondary | ICD-10-CM | POA: Diagnosis not present

## 2016-09-02 DIAGNOSIS — M9903 Segmental and somatic dysfunction of lumbar region: Secondary | ICD-10-CM | POA: Diagnosis not present

## 2016-09-24 DIAGNOSIS — M5135 Other intervertebral disc degeneration, thoracolumbar region: Secondary | ICD-10-CM | POA: Diagnosis not present

## 2016-09-24 DIAGNOSIS — M9903 Segmental and somatic dysfunction of lumbar region: Secondary | ICD-10-CM | POA: Diagnosis not present

## 2016-09-24 DIAGNOSIS — M5134 Other intervertebral disc degeneration, thoracic region: Secondary | ICD-10-CM | POA: Diagnosis not present

## 2016-09-24 DIAGNOSIS — M5136 Other intervertebral disc degeneration, lumbar region: Secondary | ICD-10-CM | POA: Diagnosis not present

## 2016-10-15 DIAGNOSIS — M5136 Other intervertebral disc degeneration, lumbar region: Secondary | ICD-10-CM | POA: Diagnosis not present

## 2016-10-15 DIAGNOSIS — M5135 Other intervertebral disc degeneration, thoracolumbar region: Secondary | ICD-10-CM | POA: Diagnosis not present

## 2016-10-15 DIAGNOSIS — M9903 Segmental and somatic dysfunction of lumbar region: Secondary | ICD-10-CM | POA: Diagnosis not present

## 2016-10-15 DIAGNOSIS — M5134 Other intervertebral disc degeneration, thoracic region: Secondary | ICD-10-CM | POA: Diagnosis not present

## 2016-11-12 DIAGNOSIS — M5135 Other intervertebral disc degeneration, thoracolumbar region: Secondary | ICD-10-CM | POA: Diagnosis not present

## 2016-11-12 DIAGNOSIS — M5134 Other intervertebral disc degeneration, thoracic region: Secondary | ICD-10-CM | POA: Diagnosis not present

## 2016-11-12 DIAGNOSIS — M9903 Segmental and somatic dysfunction of lumbar region: Secondary | ICD-10-CM | POA: Diagnosis not present

## 2016-11-12 DIAGNOSIS — M5136 Other intervertebral disc degeneration, lumbar region: Secondary | ICD-10-CM | POA: Diagnosis not present

## 2016-11-21 ENCOUNTER — Ambulatory Visit (INDEPENDENT_AMBULATORY_CARE_PROVIDER_SITE_OTHER): Payer: BLUE CROSS/BLUE SHIELD | Admitting: Allergy & Immunology

## 2016-11-21 ENCOUNTER — Encounter: Payer: Self-pay | Admitting: Allergy & Immunology

## 2016-11-21 VITALS — BP 128/78 | HR 70 | Temp 98.6°F | Resp 16 | Ht 61.5 in | Wt 187.0 lb

## 2016-11-21 DIAGNOSIS — L2084 Intrinsic (allergic) eczema: Secondary | ICD-10-CM

## 2016-11-21 DIAGNOSIS — J4530 Mild persistent asthma, uncomplicated: Secondary | ICD-10-CM | POA: Diagnosis not present

## 2016-11-21 MED ORDER — BUDESONIDE-FORMOTEROL FUMARATE 160-4.5 MCG/ACT IN AERO: 2.0000 | INHALATION_SPRAY | Freq: Two times a day (BID) | RESPIRATORY_TRACT | 5 refills | Status: DC

## 2016-11-21 MED ORDER — ALBUTEROL SULFATE HFA 108 (90 BASE) MCG/ACT IN AERS: INHALATION_SPRAY | RESPIRATORY_TRACT | 2 refills | Status: DC

## 2016-11-21 MED ORDER — MONTELUKAST SODIUM 10 MG PO TABS: 10.0000 mg | ORAL_TABLET | Freq: Every day | ORAL | 3 refills | Status: DC

## 2016-11-21 NOTE — Patient Instructions (Addendum)
1. Mild persistent asthma, uncomplicated - Spirometry looked great. - We will not make any medication changes at this time (we will change to Symbicort since there is a copay card) - Daily controller medication(s): Symbicort 160/45 two puffs once daily via spacer - Rescue medications: ProAir 4 puffs every 4-6 hours as needed - Changes during respiratory infections or worsening symptoms: increase Symbicort 160/4.5 to 2 puffs twice daily for TWO WEEKS. - Asthma control goals:  * Full participation in all desired activities (may need albuterol before activity) * Albuterol use two time or less a week on average (not counting use with activity) * Cough interfering with sleep two time or less a month * Oral steroids no more than once a year * No hospitalizations  2. Intrinsic atopic dermatitis - Continue with moisturizing twice daily as needed. - Let us know if you need refills of the medications.   3. Allergic rhinitis (cats, dogs, ragweed, horses) - Continue with Singulair as needed.   4. Return in about 6 months (around 05/21/2017).  Please inform us of any Emergency Department visits, hospitalizations, or changes in symptoms. Call us before going to the ED for breathing or allergy symptoms since we might be able to fit you in for a sick visit. Feel free to contact us anytime with any questions, problems, or concerns.  It was a pleasure to meet you today! Best wishes in the [place]!   Websites that have reliable patient information: 1. American Academy of Asthma, Allergy, and Immunology: www.aaaai.org 2. Food Allergy Research and Education (FARE): foodallergy.org 3. Mothers of Asthmatics: http://www.asthmacommunitynetwork.org 4. American College of Allergy, Asthma, and Immunology: www.acaai.org

## 2016-11-21 NOTE — Progress Notes (Signed)
FOLLOW UP  Date of Service/Encounter:  11/21/16   Assessment:   Mild persistent asthma, uncomplicated  Intrinsic atopic dermatitis   Asthma Reportables:  Severity: mild persistent  Risk: low Control: well controlled  Seasonal Influenza Vaccine: yes   Plan/Recommendations:   1. Mild persistent asthma, uncomplicated - Spirometry looked great today. - She has not been using her controller as prescribed but has remained stable. - We will not make any medication changes at this time.  - Symbicort copay card provided.  - Daily controller medication(s): Symbicort 160/45 two puffs once daily via spacer - Rescue medications: ProAir 4 puffs every 4-6 hours as needed - Changes during respiratory infections or worsening symptoms: increase Symbicort 160/4.5 to 2 puffs twice daily for TWO WEEKS. - Asthma control goals:  * Full participation in all desired activities (may need albuterol before activity) * Albuterol use two time or less a week on average (not counting use with activity) * Cough interfering with sleep two time or less a month * Oral steroids no more than once a year * No hospitalizations  2. Intrinsic atopic dermatitis - Continue with moisturizing twice daily as needed. - I asked Ms. Marcia Davis if she needs refills of the medications.   3. Allergic rhinitis (cats, dogs, ragweed, horses) - Continue with Singulair as needed.   4. Return in about 6 months (around 05/21/2017).  Subjective:   Marcia Davis is a 51 y.o. female presenting today for follow up of  Chief Complaint  Patient presents with  . Asthma    Follow up  - Medication refills  . Allergic Rhinitis     Follow up   . Eczema    Follow up     Marcia Davis has a history of the following: Patient Active Problem List   Diagnosis Date Noted  . Vaginitis and vulvovaginitis 04/15/2013  . H/O vitamin D deficiency 04/15/2013  . Depression 02/07/2012  . Anxiety 02/07/2012  . Weight disorder 02/07/2012  .  Candidiasis, intertrigo 06/12/2011  . Asthma   . Migraine   . DYSPEPSIA&OTHER SPEC DISORDERS FUNCTION STOMACH 05/22/2009  . SINUSITIS- ACUTE-NOS 08/19/2007  . ASTHMA, CHRONIC OBSTRUCTIVE NOS 04/06/2007  . HYPERTENSION 02/26/2007    History obtained from: chart review and patient.  Marcia Davis was referred by No PCP Per Patient.     Marcia CockingMaritza is a 51 y.o. female presenting for a follow up visit. She was last seen in April 2017 by Dr. Lucie LeatherKozlow. At that time, her asthma has been under good control with use of the Symbicort 2 inhalations once per day as well as Singulair 10 mg once daily.  Since last visit, She has done very well. She has not been using her record as recommended. She ends up getting around 4-5 times per week at most. Aava's asthma has been well controlled. She has not required rescue medication, experienced nocturnal awakenings due to lower respiratory symptoms, nor have activities of daily living been limited. She has required no ER visits or steroid courses. She does use a spacer, whenever she decides to use her medication.  She does have history of allergic rhinitis. Controlled with Singulair monotherapy. She has been on no sprays in the past but has not liked them. No antihistamines seem to work for her according to the patient. Her worst times of the year or the spring and fall.  Otherwise, there have been no changes to her past medical history, surgical history, family history, or social history.    Review of  Systems: a 14-point review of systems is pertinent for what is mentioned in HPI.  Otherwise, all other systems were negative. Constitutional: negative other than that listed in the HPI Eyes: negative other than that listed in the HPI Ears, nose, mouth, throat, and face: negative other than that listed in the HPI Respiratory: negative other than that listed in the HPI Cardiovascular: negative other than that listed in the HPI Gastrointestinal: negative other than  that listed in the HPI Genitourinary: negative other than that listed in the HPI Integument: negative other than that listed in the HPI Hematologic: negative other than that listed in the HPI Musculoskeletal: negative other than that listed in the HPI Neurological: negative other than that listed in the HPI Allergy/Immunologic: negative other than that listed in the HPI    Objective:   Blood pressure 128/78, pulse 70, temperature 98.6 F (37 C), temperature source Oral, resp. rate 16, height 5' 1.5" (1.562 m), weight 187 lb (84.8 kg), SpO2 98 %. Body mass index is 34.76 kg/m.   Physical Exam:  General: Alert, interactive, in no acute distress. Pleasant. Somewhat demanding. Eyes: No conjunctival injection present on the right, No conjunctival injection present on the left, PERRL bilaterally, No discharge on the right, No discharge on the left and No Horner-Trantas dots present Ears: Right TM pearly gray with normal light reflex, Left TM pearly gray with normal light reflex, Right TM intact without perforation and Left TM intact without perforation.  Nose/Throat: External nose within normal limits, nasal crease present and septum midline, turbinates edematous with clear discharge, post-pharynx mildly erythematous without cobblestoning in the posterior oropharynx. Tonsils 2+ without exudates Neck: Supple without thyromegaly. Lungs: Clear to auscultation without wheezing, rhonchi or rales. No increased work of breathing. CV: Normal S1/S2, no murmurs. Capillary refill <2 seconds.  Skin: Warm and dry, without lesions or rashes. Neuro:   Grossly intact. No focal deficits appreciated. Responsive to questions.   Diagnostic studies:  Spirometry: results normal (FEV1: 2.15/87%, FVC: 2.64/89%, FEV1/FVC: 81%).    Spirometry consistent with normal pattern.   Allergy Studies: None    Malachi Bonds, MD [facility]

## 2016-11-22 ENCOUNTER — Telehealth: Payer: Self-pay

## 2016-11-22 ENCOUNTER — Other Ambulatory Visit: Payer: Self-pay

## 2016-11-22 NOTE — Telephone Encounter (Signed)
Received fax from [facility] denying Ventolin INH HFA 200 18 gm.  Alternatives:   Levalbuterol Tartrate CFC-free aerosol ProAir HFA ProAir Respiclick  Please advise which alternative you would like to use.

## 2016-11-23 NOTE — Telephone Encounter (Signed)
ProAir is fine. Thanks!   Malachi BondsJoel Pietrina Jagodzinski, MD [facility] [place] [place]

## 2016-11-25 MED ORDER — ALBUTEROL SULFATE HFA 108 (90 BASE) MCG/ACT IN AERS: 4.0000 | INHALATION_SPRAY | Freq: Four times a day (QID) | RESPIRATORY_TRACT | 2 refills | Status: DC | PRN

## 2016-11-25 NOTE — Telephone Encounter (Signed)
ProAir sent to pharmacy per Dr. Dellis AnesGallagher.

## 2016-11-26 ENCOUNTER — Other Ambulatory Visit: Payer: Self-pay | Admitting: *Deleted

## 2016-11-26 MED ORDER — ALBUTEROL SULFATE HFA 108 (90 BASE) MCG/ACT IN AERS: 2.0000 | INHALATION_SPRAY | RESPIRATORY_TRACT | 0 refills | Status: DC | PRN

## 2016-11-27 ENCOUNTER — Other Ambulatory Visit: Payer: Self-pay | Admitting: *Deleted

## 2016-11-27 MED ORDER — ALBUTEROL SULFATE HFA 108 (90 BASE) MCG/ACT IN AERS: 2.0000 | INHALATION_SPRAY | RESPIRATORY_TRACT | 1 refills | Status: DC | PRN

## 2016-12-02 ENCOUNTER — Telehealth: Payer: Self-pay | Admitting: Allergy & Immunology

## 2016-12-02 ENCOUNTER — Other Ambulatory Visit: Payer: Self-pay

## 2016-12-02 MED ORDER — BUDESONIDE-FORMOTEROL FUMARATE 160-4.5 MCG/ACT IN AERO: 2.0000 | INHALATION_SPRAY | Freq: Two times a day (BID) | RESPIRATORY_TRACT | 1 refills | Status: DC

## 2016-12-02 NOTE — Telephone Encounter (Signed)
Called patient and left a message for her to give us a call back. We need to inform her that we sent In another prescription for 90 supply for the symbicort. Let her know that it won't be filled until the 30 day supply of symbicort  runs out.

## 2016-12-02 NOTE — Telephone Encounter (Signed)
Pt called and said that she received her rx from mail order and she received 3 albuterol inhalers and only 1 symbicort  [phone].

## 2016-12-18 DIAGNOSIS — M5135 Other intervertebral disc degeneration, thoracolumbar region: Secondary | ICD-10-CM | POA: Diagnosis not present

## 2016-12-18 DIAGNOSIS — M5136 Other intervertebral disc degeneration, lumbar region: Secondary | ICD-10-CM | POA: Diagnosis not present

## 2016-12-18 DIAGNOSIS — M9903 Segmental and somatic dysfunction of lumbar region: Secondary | ICD-10-CM | POA: Diagnosis not present

## 2016-12-18 DIAGNOSIS — M5134 Other intervertebral disc degeneration, thoracic region: Secondary | ICD-10-CM | POA: Diagnosis not present

## 2017-01-07 ENCOUNTER — Other Ambulatory Visit: Payer: Self-pay | Admitting: *Deleted

## 2017-01-07 MED ORDER — BUDESONIDE-FORMOTEROL FUMARATE 160-4.5 MCG/ACT IN AERO: 2.0000 | INHALATION_SPRAY | Freq: Two times a day (BID) | RESPIRATORY_TRACT | 1 refills | Status: DC

## 2017-01-07 NOTE — Telephone Encounter (Signed)
Patient walked into the office very concerned about her prescriptions. She states she has now received 6 rescue inhalers and no preventative medication. She has also been billed for 6 inhalers. Her main concern is she is running low on her Symbicort.  After reviewing EPIC, I gave Mrs. Marcia Davis a Symbicort 160 sample and told her we would work on  1. Seeing if the pharmacy will accept any of the extra inhalers back.  2. We will make sure her Symbicort is sent to her mail order pharmacy and not a local pharmacy.  3. If the mail order pharmacy will accept even 3 of her rescue inhalers back, we will mail them out for her so she doesn't have the expense of shipping coming out of her pocket. I asked Herbert SetaHeather to follow up with her mail order pharmacy. Mrs. Marcia Davis left very satisfied with our plan of action.

## 2017-01-08 NOTE — Telephone Encounter (Signed)
[facility] stated they were unable to take back the ProAir since it was not a [facility] error. Marcia Davis informed. Sent in the correct script for Symbicort 90 day supply to [facility].

## 2017-01-15 DIAGNOSIS — M5135 Other intervertebral disc degeneration, thoracolumbar region: Secondary | ICD-10-CM | POA: Diagnosis not present

## 2017-01-15 DIAGNOSIS — M5136 Other intervertebral disc degeneration, lumbar region: Secondary | ICD-10-CM | POA: Diagnosis not present

## 2017-01-15 DIAGNOSIS — M9903 Segmental and somatic dysfunction of lumbar region: Secondary | ICD-10-CM | POA: Diagnosis not present

## 2017-01-15 DIAGNOSIS — M5134 Other intervertebral disc degeneration, thoracic region: Secondary | ICD-10-CM | POA: Diagnosis not present

## 2017-02-12 DIAGNOSIS — M5136 Other intervertebral disc degeneration, lumbar region: Secondary | ICD-10-CM | POA: Diagnosis not present

## 2017-02-12 DIAGNOSIS — M9903 Segmental and somatic dysfunction of lumbar region: Secondary | ICD-10-CM | POA: Diagnosis not present

## 2017-02-12 DIAGNOSIS — M5135 Other intervertebral disc degeneration, thoracolumbar region: Secondary | ICD-10-CM | POA: Diagnosis not present

## 2017-02-12 DIAGNOSIS — M5134 Other intervertebral disc degeneration, thoracic region: Secondary | ICD-10-CM | POA: Diagnosis not present

## 2017-02-13 DIAGNOSIS — J45909 Unspecified asthma, uncomplicated: Secondary | ICD-10-CM | POA: Insufficient documentation

## 2017-02-14 DIAGNOSIS — Z6834 Body mass index (BMI) 34.0-34.9, adult: Secondary | ICD-10-CM | POA: Diagnosis not present

## 2017-02-14 DIAGNOSIS — Z1151 Encounter for screening for human papillomavirus (HPV): Secondary | ICD-10-CM | POA: Diagnosis not present

## 2017-02-14 DIAGNOSIS — N951 Menopausal and female climacteric states: Secondary | ICD-10-CM | POA: Diagnosis not present

## 2017-02-14 DIAGNOSIS — Z1389 Encounter for screening for other disorder: Secondary | ICD-10-CM | POA: Diagnosis not present

## 2017-02-14 DIAGNOSIS — L299 Pruritus, unspecified: Secondary | ICD-10-CM | POA: Diagnosis not present

## 2017-02-14 DIAGNOSIS — Z124 Encounter for screening for malignant neoplasm of cervix: Secondary | ICD-10-CM | POA: Diagnosis not present

## 2017-02-14 DIAGNOSIS — Z130 Encounter for screening for diseases of the blood and blood-forming organs and certain disorders involving the immune mechanism: Secondary | ICD-10-CM | POA: Diagnosis not present

## 2017-02-14 DIAGNOSIS — Z01419 Encounter for gynecological examination (general) (routine) without abnormal findings: Secondary | ICD-10-CM | POA: Diagnosis not present

## 2017-02-14 DIAGNOSIS — Z0000 Encounter for general adult medical examination without abnormal findings: Secondary | ICD-10-CM | POA: Diagnosis not present

## 2017-02-26 ENCOUNTER — Encounter: Payer: Self-pay | Admitting: Gynecology

## 2017-03-17 DIAGNOSIS — M5135 Other intervertebral disc degeneration, thoracolumbar region: Secondary | ICD-10-CM | POA: Diagnosis not present

## 2017-03-17 DIAGNOSIS — M5136 Other intervertebral disc degeneration, lumbar region: Secondary | ICD-10-CM | POA: Diagnosis not present

## 2017-03-17 DIAGNOSIS — M5134 Other intervertebral disc degeneration, thoracic region: Secondary | ICD-10-CM | POA: Diagnosis not present

## 2017-03-17 DIAGNOSIS — M9903 Segmental and somatic dysfunction of lumbar region: Secondary | ICD-10-CM | POA: Diagnosis not present

## 2017-04-22 DIAGNOSIS — M5135 Other intervertebral disc degeneration, thoracolumbar region: Secondary | ICD-10-CM | POA: Diagnosis not present

## 2017-04-22 DIAGNOSIS — M5136 Other intervertebral disc degeneration, lumbar region: Secondary | ICD-10-CM | POA: Diagnosis not present

## 2017-04-22 DIAGNOSIS — M5134 Other intervertebral disc degeneration, thoracic region: Secondary | ICD-10-CM | POA: Diagnosis not present

## 2017-04-22 DIAGNOSIS — M9903 Segmental and somatic dysfunction of lumbar region: Secondary | ICD-10-CM | POA: Diagnosis not present

## 2017-05-13 DIAGNOSIS — L293 Anogenital pruritus, unspecified: Secondary | ICD-10-CM | POA: Diagnosis not present

## 2017-05-13 DIAGNOSIS — N909 Noninflammatory disorder of vulva and perineum, unspecified: Secondary | ICD-10-CM | POA: Diagnosis not present

## 2017-05-20 DIAGNOSIS — M5134 Other intervertebral disc degeneration, thoracic region: Secondary | ICD-10-CM | POA: Diagnosis not present

## 2017-05-20 DIAGNOSIS — M5136 Other intervertebral disc degeneration, lumbar region: Secondary | ICD-10-CM | POA: Diagnosis not present

## 2017-05-20 DIAGNOSIS — M9903 Segmental and somatic dysfunction of lumbar region: Secondary | ICD-10-CM | POA: Diagnosis not present

## 2017-05-20 DIAGNOSIS — M5135 Other intervertebral disc degeneration, thoracolumbar region: Secondary | ICD-10-CM | POA: Diagnosis not present

## 2017-07-01 DIAGNOSIS — M5135 Other intervertebral disc degeneration, thoracolumbar region: Secondary | ICD-10-CM | POA: Diagnosis not present

## 2017-07-01 DIAGNOSIS — M9903 Segmental and somatic dysfunction of lumbar region: Secondary | ICD-10-CM | POA: Diagnosis not present

## 2017-07-01 DIAGNOSIS — M5134 Other intervertebral disc degeneration, thoracic region: Secondary | ICD-10-CM | POA: Diagnosis not present

## 2017-07-01 DIAGNOSIS — M5136 Other intervertebral disc degeneration, lumbar region: Secondary | ICD-10-CM | POA: Diagnosis not present

## 2017-07-03 ENCOUNTER — Encounter: Payer: Self-pay | Admitting: Allergy

## 2017-07-03 ENCOUNTER — Ambulatory Visit (INDEPENDENT_AMBULATORY_CARE_PROVIDER_SITE_OTHER): Payer: BLUE CROSS/BLUE SHIELD | Admitting: Allergy

## 2017-07-03 VITALS — BP 110/74 | HR 67 | Temp 98.2°F | Resp 16

## 2017-07-03 DIAGNOSIS — L2084 Intrinsic (allergic) eczema: Secondary | ICD-10-CM | POA: Diagnosis not present

## 2017-07-03 DIAGNOSIS — J3081 Allergic rhinitis due to animal (cat) (dog) hair and dander: Secondary | ICD-10-CM

## 2017-07-03 DIAGNOSIS — J4530 Mild persistent asthma, uncomplicated: Secondary | ICD-10-CM | POA: Diagnosis not present

## 2017-07-03 MED ORDER — MONTELUKAST SODIUM 10 MG PO TABS: 10.0000 mg | ORAL_TABLET | Freq: Every day | ORAL | 3 refills | Status: DC

## 2017-07-03 NOTE — Addendum Note (Signed)
Addended by: Lorrin Mais on: 07/03/2017 05:40 PM   Modules accepted: Level of Service

## 2017-07-03 NOTE — Patient Instructions (Addendum)
1. Mild persistent asthma, uncomplicated - Begin Symbicort 80/4.5 two puffs daily. If you are experiencing symptoms go back to Symbicort 160/4.5. - May increase to two puffs twice a day if needed - Continue to use albuterol inhaler as 1-2 puffs every 4 hours as needed  2. Intrinsic atopic dermatitis - Continue with current moisturizing regimen  3. Allergic rhinitis due to animal hair and dander - Continue use of Singulair  as needed - Continue use of saline nasal wash as needed  4. Follow up: in 1 year

## 2017-07-03 NOTE — Progress Notes (Addendum)
FOLLOW UP  Date of Service/Encounter:  07/03/17   Assessment:   Mild persistent asthma, uncomplicated   Intrinsic atopic dermatitis  Allergic rhinitis due to animal hair and dander    Asthma Reportables:  Severity: mild persistent  Risk: low Control: well controlled  Seasonal Influenza Vaccine: no but encouraged    Plan/Recommendations:      1. Mild persistent asthma, uncomplicated - will step down therapy as well controlled to Symbicort 80/4.5 two puffs daily. If you are experiencing symptoms or not meeting below goals go back to Symbicort 160/4.5 2 puffs daily. - May increase to two puffs twice a day if needed during asthma flare/respiratory illness - Continue to use albuterol inhaler as 1-2 puffs every 4 hours as needed Asthma control goals:   Full participation in all desired activities (may need albuterol before activity)  Albuterol use two time or less a week on average (not counting use with activity)  Cough interfering with sleep two time or less a month  Oral steroids no more than once a year  No hospitalizations  2. Intrinsic atopic dermatitis - Continue with current moisturizing regimen  3. Allergic rhinitis due to animal hair and dander - Continue use of Singulair  as needed - Continue use of saline nasal wash as needed  4. Follow up: in 1 year     Subjective:   Danell Vazquez is a 51 y.o. female presenting today for follow up of  Chief Complaint  Patient presents with  . Asthma  . Medication Refill    Joei Frangos has a history of the following: Patient Active Problem List   Diagnosis Date Noted  . Vaginitis and vulvovaginitis 04/15/2013  . H/O vitamin D deficiency 04/15/2013  . Depression 02/07/2012  . Anxiety 02/07/2012  . Weight disorder 02/07/2012  . Candidiasis, intertrigo 06/12/2011  . Asthma   . Migraine   . DYSPEPSIA&OTHER SPEC DISORDERS FUNCTION STOMACH 05/22/2009  . SINUSITIS- ACUTE-NOS 08/19/2007  . ASTHMA,  CHRONIC OBSTRUCTIVE NOS 04/06/2007  . HYPERTENSION 02/26/2007    History obtained from: chart review and patient interview.  [facility]'s Primary Care Provider .     Dina is a 51 y.o. female presenting for a follow up visit. She was last seen in the clinic on 11/21/2016 for evaluation of moderate persistent asthma, atopic dermatitis, and allergic rhinitis. At that time, her asthma was under good control with the use of Symbicort 160/4.5 one inhalation every morning as well as Singulair 10 mg as needed.   Since the last visit, she has done very well. She is currently using Symbicort 160/4.5 one inhalation every morning and her albuterol 1 puff 1 time a year. Geneve's asthma has been well controlled. She has not required rescue medication, experienced nocturnal awakenings due to lower respiratory symptoms, nor have activities of daily living been limited. She has required no Emergency Department or Urgent Care visits for her asthma. She has required zero courses of systemic steroids for asthma exacerbations since the last visit. ACT score today is 25, indicating excellent asthma symptom control.   She does have a history of allergic rhinitis which is controlled with Singulair and nasal saline spary on an as needed basis. She reports increased rhinitis when she is near cats. She reports a history of allergy testing when she was in junior high which was positive to cats with immunotherapy treatment for 7 years.  Otherwise, there have been no changes to her past medical history, surgical history, family history, or social history.  Review of Systems: a 14-point review of systems is pertinent for what is mentioned in HPI.  Otherwise, all other systems were negative. Constitutional: negative other than that listed in the HPI Eyes: negative other than that listed in the HPI Ears, nose, mouth, throat, and face: negative other than that listed in the HPI Respiratory: negative other than that listed in  the HPI Cardiovascular: negative other than that listed in the HPI Gastrointestinal: negative other than that listed in the HPI Genitourinary: negative other than that listed in the HPI Integument: negative other than that listed in the HPI Hematologic: negative other than that listed in the HPI Musculoskeletal: negative other than that listed in the HPI Neurological: negative other than that listed in the HPI Allergy/Immunologic: negative other than that listed in the HPI    Objective:   Blood pressure 110/74, pulse 67, temperature 98.2 F (36.8 C), temperature source Oral, resp. rate 16, SpO2 96 %.   Physical Exam:  General: Alert, interactive, in no acute distress. Eyes: No conjunctival injection present on the right and No conjunctival injection present on the left Ears: Right TM pearly gray with normal light reflex and Left TM pearly gray with normal light reflex.  Nose/Throat: External nose within normal limits and septum midline, turbinates mildly edematous without discharge, post-pharynx moderately erythematous without cobblestoning in the posterior oropharynx. Tonsils unremarklable without exudates Neck: Supple without thyromegaly. Lungs: Clear to auscultation without wheezing, rhonchi or rales. No increased work of breathing. CV: Normal S1/S2, no murmurs. Capillary refill <2 seconds.  Skin: Warm and dry, without lesions or rashes. Neuro:   Grossly intact. No focal deficits appreciated. Responsive to questions.   Spirometry: results normal (FEV1: 2.19/89%, FVC: 2.56/87%, FEV1/FVC: 85/102%).    Hetty Blend, FNP-C [facility]   I performed a history and physical examination of the patient and discussed management with the NP Amvs. I reviewed the NP's note and agree with the documented findings and plan of care. The note in its entirety was edited by myself, including the history, physical exam, assessment, and plan.   Margo Aye,  MD [facility]

## 2017-07-07 ENCOUNTER — Other Ambulatory Visit: Payer: Self-pay | Admitting: Obstetrics and Gynecology

## 2017-07-07 DIAGNOSIS — Z1231 Encounter for screening mammogram for malignant neoplasm of breast: Secondary | ICD-10-CM

## 2017-07-08 ENCOUNTER — Ambulatory Visit
Admission: RE | Admit: 2017-07-08 | Discharge: 2017-07-08 | Disposition: A | Discharge status: Home / Self Care | Payer: BLUE CROSS/BLUE SHIELD | Source: Ambulatory Visit | Attending: Obstetrics and Gynecology | Admitting: Obstetrics and Gynecology

## 2017-07-08 DIAGNOSIS — Z1231 Encounter for screening mammogram for malignant neoplasm of breast: Secondary | ICD-10-CM | POA: Diagnosis not present

## 2017-07-19 ENCOUNTER — Other Ambulatory Visit: Payer: Self-pay | Admitting: Allergy & Immunology

## 2017-07-29 DIAGNOSIS — M5135 Other intervertebral disc degeneration, thoracolumbar region: Secondary | ICD-10-CM | POA: Diagnosis not present

## 2017-07-29 DIAGNOSIS — M5136 Other intervertebral disc degeneration, lumbar region: Secondary | ICD-10-CM | POA: Diagnosis not present

## 2017-07-29 DIAGNOSIS — M5134 Other intervertebral disc degeneration, thoracic region: Secondary | ICD-10-CM | POA: Diagnosis not present

## 2017-07-29 DIAGNOSIS — M9903 Segmental and somatic dysfunction of lumbar region: Secondary | ICD-10-CM | POA: Diagnosis not present

## 2017-08-05 ENCOUNTER — Other Ambulatory Visit: Payer: Self-pay | Admitting: Allergy

## 2017-08-05 NOTE — Telephone Encounter (Signed)
Patient called back and said Symbicort was the 80 strength. She said she needed 90 day supply for a year. She is not due back until Sept 2019. I told her asthma patients needed to be seen every 6 months. She said no, her and Dr. Delorse LekPadgett discussed only coming once a year because her deductible is so high. So she is requesting refills for a year.

## 2017-08-05 NOTE — Telephone Encounter (Signed)
Pt called and needs to have a rx symbicort called into [facility]  3 month supply [phone].

## 2017-08-05 NOTE — Telephone Encounter (Signed)
L/M for patient need to know which strength she is requesting 160 or 80 Symbicort because Dr Delorse LekPadgett had decreased her in Sept to to 80 and told to go back to 160 if she was having problems.  Send 90 day with 2 refills she in not due back until Sept 2019

## 2017-08-06 MED ORDER — BUDESONIDE-FORMOTEROL FUMARATE 80-4.5 MCG/ACT IN AERO: 2.0000 | INHALATION_SPRAY | Freq: Two times a day (BID) | RESPIRATORY_TRACT | 3 refills | Status: DC

## 2017-08-06 NOTE — Telephone Encounter (Signed)
Medication sent into the pharmacy and patient informed.

## 2017-08-26 DIAGNOSIS — M5134 Other intervertebral disc degeneration, thoracic region: Secondary | ICD-10-CM | POA: Diagnosis not present

## 2017-08-26 DIAGNOSIS — M9903 Segmental and somatic dysfunction of lumbar region: Secondary | ICD-10-CM | POA: Diagnosis not present

## 2017-08-26 DIAGNOSIS — M5136 Other intervertebral disc degeneration, lumbar region: Secondary | ICD-10-CM | POA: Diagnosis not present

## 2017-08-26 DIAGNOSIS — M5135 Other intervertebral disc degeneration, thoracolumbar region: Secondary | ICD-10-CM | POA: Diagnosis not present

## 2017-09-30 DIAGNOSIS — M5135 Other intervertebral disc degeneration, thoracolumbar region: Secondary | ICD-10-CM | POA: Diagnosis not present

## 2017-09-30 DIAGNOSIS — M5134 Other intervertebral disc degeneration, thoracic region: Secondary | ICD-10-CM | POA: Diagnosis not present

## 2017-09-30 DIAGNOSIS — M9903 Segmental and somatic dysfunction of lumbar region: Secondary | ICD-10-CM | POA: Diagnosis not present

## 2017-09-30 DIAGNOSIS — M5136 Other intervertebral disc degeneration, lumbar region: Secondary | ICD-10-CM | POA: Diagnosis not present

## 2017-10-28 DIAGNOSIS — M9903 Segmental and somatic dysfunction of lumbar region: Secondary | ICD-10-CM | POA: Diagnosis not present

## 2017-10-28 DIAGNOSIS — M5134 Other intervertebral disc degeneration, thoracic region: Secondary | ICD-10-CM | POA: Diagnosis not present

## 2017-10-28 DIAGNOSIS — M5136 Other intervertebral disc degeneration, lumbar region: Secondary | ICD-10-CM | POA: Diagnosis not present

## 2017-10-28 DIAGNOSIS — M5135 Other intervertebral disc degeneration, thoracolumbar region: Secondary | ICD-10-CM | POA: Diagnosis not present

## 2017-11-25 DIAGNOSIS — M5136 Other intervertebral disc degeneration, lumbar region: Secondary | ICD-10-CM | POA: Diagnosis not present

## 2017-11-25 DIAGNOSIS — M5135 Other intervertebral disc degeneration, thoracolumbar region: Secondary | ICD-10-CM | POA: Diagnosis not present

## 2017-11-25 DIAGNOSIS — M9903 Segmental and somatic dysfunction of lumbar region: Secondary | ICD-10-CM | POA: Diagnosis not present

## 2017-11-25 DIAGNOSIS — M5134 Other intervertebral disc degeneration, thoracic region: Secondary | ICD-10-CM | POA: Diagnosis not present

## 2017-12-23 DIAGNOSIS — M5134 Other intervertebral disc degeneration, thoracic region: Secondary | ICD-10-CM | POA: Diagnosis not present

## 2017-12-23 DIAGNOSIS — M5135 Other intervertebral disc degeneration, thoracolumbar region: Secondary | ICD-10-CM | POA: Diagnosis not present

## 2017-12-23 DIAGNOSIS — M5136 Other intervertebral disc degeneration, lumbar region: Secondary | ICD-10-CM | POA: Diagnosis not present

## 2017-12-23 DIAGNOSIS — M9903 Segmental and somatic dysfunction of lumbar region: Secondary | ICD-10-CM | POA: Diagnosis not present

## 2018-01-06 DIAGNOSIS — M9903 Segmental and somatic dysfunction of lumbar region: Secondary | ICD-10-CM | POA: Diagnosis not present

## 2018-01-06 DIAGNOSIS — M5134 Other intervertebral disc degeneration, thoracic region: Secondary | ICD-10-CM | POA: Diagnosis not present

## 2018-01-06 DIAGNOSIS — M5136 Other intervertebral disc degeneration, lumbar region: Secondary | ICD-10-CM | POA: Diagnosis not present

## 2018-01-06 DIAGNOSIS — M5135 Other intervertebral disc degeneration, thoracolumbar region: Secondary | ICD-10-CM | POA: Diagnosis not present

## 2018-01-22 ENCOUNTER — Ambulatory Visit (INDEPENDENT_AMBULATORY_CARE_PROVIDER_SITE_OTHER): Payer: BLUE CROSS/BLUE SHIELD | Admitting: Family Medicine

## 2018-01-22 ENCOUNTER — Encounter: Payer: Self-pay | Admitting: Family Medicine

## 2018-01-22 VITALS — BP 108/66 | HR 68 | Resp 16

## 2018-01-22 DIAGNOSIS — T781XXD Other adverse food reactions, not elsewhere classified, subsequent encounter: Secondary | ICD-10-CM

## 2018-01-22 DIAGNOSIS — J302 Other seasonal allergic rhinitis: Secondary | ICD-10-CM | POA: Diagnosis not present

## 2018-01-22 DIAGNOSIS — J3089 Other allergic rhinitis: Secondary | ICD-10-CM | POA: Diagnosis not present

## 2018-01-22 DIAGNOSIS — T781XXA Other adverse food reactions, not elsewhere classified, initial encounter: Secondary | ICD-10-CM | POA: Insufficient documentation

## 2018-01-22 DIAGNOSIS — J4530 Mild persistent asthma, uncomplicated: Secondary | ICD-10-CM | POA: Diagnosis not present

## 2018-01-22 MED ORDER — MONTELUKAST SODIUM 10 MG PO TABS: 10.0000 mg | ORAL_TABLET | Freq: Every day | ORAL | 1 refills | Status: DC

## 2018-01-22 NOTE — Patient Instructions (Addendum)
Food allergies - Your skin testing today was slightly positive to milk and wheat (+/-) but this is likely insignificant. Continue to include these items in your diet.  - Keep a food journal to track your symptoms.  Mild persistent asthma, uncomplicated - Continue Symbicort 80/4.5 two puffs daily. If you are experiencing symptoms go back to Symbicort 160/4.5. - May increase to two puffs twice a day if needed - Continue to use albuterol inhaler as 1-2 puffs every 4 hours as needed   Intrinsic atopic dermatitis - Continue with current moisturizing regimen  Allergic rhinitis due to animal hair and dander - Continue use of Singulair 10mg  as needed - Continue use of saline nasal wash as needed  Follow up: in 6 months or sooner of needed

## 2018-01-22 NOTE — Progress Notes (Signed)
[address] [place] [address] Dept: [phone]  FOLLOW UP NOTE  Patient ID: Marcia SandersMaritza Davis, female    DOB: July 10, 1966  Age: 52 y.o. MRN: [id number] Date of Office Visit: 01/22/2018  Assessment  Chief Complaint: Allergy Testing  HPI Marcia SandersMaritza Davis is a 52 year old female who presents to the clinic today for allergy testing to foods.  She reports a history of gastric issues including vomiting and diarrhea that began 3 weeks ago and lasted for 1 week.  She reports the incident occurred after eating at a salad bar.  Since that time, she has been avoiding all uncooked vegetables.  She also reports that bananas because her abdominal pain and diarrhea if she eats them in the morning with no other foods.  She reports GI symptoms of abdominal pain and diarrhea after eating mushrooms and cheese.  She reports that she has nausea and diarrhea about every other year when she eats at a salad bar.  She denies concomitant cardiopulmonary symptoms or hives.  [name] asthma has been well controlled. She has not required rescue medication, experienced nocturnal awakenings due to lower respiratory symptoms, nor have activities of daily living been limited. She has required no Emergency Department or Urgent Care visits for her asthma. She has required zero courses of systemic steroids for asthma exacerbations since the last visit. ACT score today is 25, indicating excellent asthma symptom control.  She continues Symbicort 80-2 puffs once a day and montelukast 10 mg once a day.   Drug Allergies:  Allergies  Allergen Reactions  . Codeine     REACTION: nausea    Physical Exam: BP 108/66 (BP Location: Right Arm, Patient Position: Sitting, Cuff Size: Normal)   Pulse 68   Resp 16    Physical Exam  Constitutional: She is oriented to person, place, and time. She appears well-developed and well-nourished.  HENT:  Head: Normocephalic.  Right Ear: External ear normal.  Left Ear: External ear  normal.  Nose: Nose normal.  Mouth/Throat: Oropharynx is clear and moist.  Eyes: Conjunctivae are normal.  Neck: Normal range of motion.  Cardiovascular: Normal rate, regular rhythm and normal heart sounds.  No murmur noted  Pulmonary/Chest: Effort normal and breath sounds normal.  Lungs clear to auscultation  Musculoskeletal: Normal range of motion.  Neurological: She is alert and oriented to person, place, and time.  Skin: Skin is warm and dry.  Psychiatric: She has a normal mood and affect. Her behavior is normal.    Diagnostics: Active 52.72, FEV1 2.38.  Predicted FVC 3.19, predicted FEV1 2.58.  Spirometry is within the normal range.  Assessment and Plan: 1. Adverse food reaction, subsequent encounter   2. Mild persistent asthma, uncomplicated   3. Seasonal and perennial allergic rhinitis     Meds ordered this encounter  Medications  . montelukast (SINGULAIR) 10 MG tablet    Sig: Take 1 tablet (10 mg total) by mouth at bedtime. TAKE ONE TABLET ONCE DAILY AS DIRECTED    Dispense:  90 tablet    Refill:  1    90-DAY SUPPLY    Patient Instructions  Food allergies - Your skin testing today was slightly positive to milk and wheat (+/-) but this is likely insignificant. Continue to include these items in your diet.  - Keep a food journal to track your symptoms.  Mild persistent asthma, uncomplicated - Continue Symbicort 80/4.5 two puffs daily. If you are experiencing symptoms go back to Symbicort 160/4.5. - May increase to two puffs twice  a day if needed - Continue to use albuterol inhaler as 1-2 puffs every 4 hours as needed   Intrinsic atopic dermatitis - Continue with current moisturizing regimen  Allergic rhinitis due to animal hair and dander - Continue use of Singulair 10mg  as needed - Continue use of saline nasal wash as needed  Follow up: in 6 months or sooner of needed   Return in about 6 months (around 07/24/2018), or if symptoms worsen or fail to  improve.    Thank you for the opportunity to care for this patient.  Please do not hesitate to contact me with questions.  Thermon Leyland, FNP [facility]

## 2018-01-29 ENCOUNTER — Encounter: Payer: Self-pay | Admitting: *Deleted

## 2018-02-03 DIAGNOSIS — M5134 Other intervertebral disc degeneration, thoracic region: Secondary | ICD-10-CM | POA: Diagnosis not present

## 2018-02-03 DIAGNOSIS — M5136 Other intervertebral disc degeneration, lumbar region: Secondary | ICD-10-CM | POA: Diagnosis not present

## 2018-02-03 DIAGNOSIS — M9903 Segmental and somatic dysfunction of lumbar region: Secondary | ICD-10-CM | POA: Diagnosis not present

## 2018-02-03 DIAGNOSIS — M5135 Other intervertebral disc degeneration, thoracolumbar region: Secondary | ICD-10-CM | POA: Diagnosis not present

## 2018-03-17 DIAGNOSIS — M9903 Segmental and somatic dysfunction of lumbar region: Secondary | ICD-10-CM | POA: Diagnosis not present

## 2018-03-17 DIAGNOSIS — M5136 Other intervertebral disc degeneration, lumbar region: Secondary | ICD-10-CM | POA: Diagnosis not present

## 2018-03-17 DIAGNOSIS — M5134 Other intervertebral disc degeneration, thoracic region: Secondary | ICD-10-CM | POA: Diagnosis not present

## 2018-03-17 DIAGNOSIS — M5135 Other intervertebral disc degeneration, thoracolumbar region: Secondary | ICD-10-CM | POA: Diagnosis not present

## 2018-03-18 ENCOUNTER — Encounter

## 2018-03-18 ENCOUNTER — Encounter: Payer: Self-pay | Admitting: Physician Assistant

## 2018-03-18 ENCOUNTER — Other Ambulatory Visit (INDEPENDENT_AMBULATORY_CARE_PROVIDER_SITE_OTHER): Payer: BLUE CROSS/BLUE SHIELD

## 2018-03-18 ENCOUNTER — Ambulatory Visit (INDEPENDENT_AMBULATORY_CARE_PROVIDER_SITE_OTHER): Payer: BLUE CROSS/BLUE SHIELD | Admitting: Physician Assistant

## 2018-03-18 VITALS — BP 120/72 | HR 76 | Ht 62.0 in | Wt 176.0 lb

## 2018-03-18 DIAGNOSIS — R1013 Epigastric pain: Secondary | ICD-10-CM

## 2018-03-18 DIAGNOSIS — R110 Nausea: Secondary | ICD-10-CM

## 2018-03-18 DIAGNOSIS — R197 Diarrhea, unspecified: Secondary | ICD-10-CM

## 2018-03-18 LAB — HIGH SENSITIVITY CRP: CRP, High Sensitivity: 0.64 mg/L (ref 0.000–5.000)

## 2018-03-18 LAB — CBC WITH DIFFERENTIAL/PLATELET
Basophils Absolute: 0.1 10*3/uL (ref 0.0–0.1)
Basophils Relative: 0.9 % (ref 0.0–3.0)
Eosinophils Absolute: 0.9 10*3/uL — ABNORMAL HIGH (ref 0.0–0.7)
Eosinophils Relative: 13.5 % — ABNORMAL HIGH (ref 0.0–5.0)
HCT: 37 % (ref 36.0–46.0)
Hemoglobin: 12.1 g/dL (ref 12.0–15.0)
Lymphocytes Relative: 33.8 % (ref 12.0–46.0)
Lymphs Abs: 2.3 10*3/uL (ref 0.7–4.0)
MCHC: 32.8 g/dL (ref 30.0–36.0)
MCV: 85.6 fl (ref 78.0–100.0)
Monocytes Absolute: 0.5 10*3/uL (ref 0.1–1.0)
Monocytes Relative: 6.7 % (ref 3.0–12.0)
Neutro Abs: 3 10*3/uL (ref 1.4–7.7)
Neutrophils Relative %: 45.1 % (ref 43.0–77.0)
Platelets: 183 10*3/uL (ref 150.0–400.0)
RBC: 4.32 Mil/uL (ref 3.87–5.11)
RDW: 14.2 % (ref 11.5–15.5)
WBC: 6.7 10*3/uL (ref 4.0–10.5)

## 2018-03-18 LAB — COMPREHENSIVE METABOLIC PANEL
ALT: 11 U/L (ref 0–35)
AST: 12 U/L (ref 0–37)
Albumin: 4.3 g/dL (ref 3.5–5.2)
Alkaline Phosphatase: 59 U/L (ref 39–117)
BUN: 13 mg/dL (ref 6–23)
CO2: 29 mEq/L (ref 19–32)
Calcium: 9.3 mg/dL (ref 8.4–10.5)
Chloride: 104 mEq/L (ref 96–112)
Creatinine, Ser: 0.67 mg/dL (ref 0.40–1.20)
GFR: 98.91 mL/min (ref >60.00)
Glucose, Bld: 79 mg/dL (ref 70–99)
Potassium: 4.4 mEq/L (ref 3.5–5.1)
Sodium: 139 mEq/L (ref 135–145)
Total Bilirubin: 0.3 mg/dL (ref 0.2–1.2)
Total Protein: 6.9 g/dL (ref 6.0–8.3)

## 2018-03-18 LAB — SEDIMENTATION RATE: Sed Rate: 43 mm/hr — ABNORMAL HIGH (ref 0–30)

## 2018-03-18 MED ORDER — NA SULFATE-K SULFATE-MG SULF 17.5-3.13-1.6 GM/177ML PO SOLN: ORAL | 0 refills | Status: DC

## 2018-03-18 NOTE — Progress Notes (Signed)
Subjective:    Patient ID: Marcia Davis, female    DOB: [birth year], 52 y.o.   MRN: [id number]  HPI Marcia Davis is a pleasant 52 year old female, established with Dr. Rush Landmark.  She was last seen in our office in 2017 with what was felt to be a gastroenteritis. Remotely she had been seen by Dr. Deatra Ina and had undergone endoscopy and sigmoidoscopy in 2004.  Sigmoidoscopy was negative.  She was noted to have a 5 mm gastric polyp on EGD. Patient has history of hypertension, asthma and migraines. Patient comes in today because of onset of symptoms that have been present since April 2019.  She says she developed acute episodes of epigastric pain pressure and distention in her upper abdomen after she had eaten a large salad.  She says she has had some similar episodes in the past since April these episodes have been much more frequent and more intense.  She had a particularly bad episode this past weekend which occurred about 2 hours after eating a meal, was associated with pain upper abdominal bloating distention some radiation of pain into her back, nausea without vomiting.  She says the discomfort then seems to move down into her lower abdomen and frequently she will have urgent diarrhea.  This happened over this past weekend with several episodes of extreme urgency and incontinence which she says was humiliating.  She had to be helped by her husband and says she never wants to go through that again.   Since then she has had some ongoing nausea but has been able to eat, she had one episode of diarrhea today. She says these episodes are sporadic, does not seem to be brought on by any particular foods, she had one episode after eating pineapple another episode after eating half a banana.  There usually at least 30 minutes after eating and once the pain starts is present for a couple of hours. She reports having prior food allergy testing all of which was negative.    She has not been eating much over the past  couple of weeks for fear of bringing on symptoms and has lost about 4 pounds. No new medications, no recent antibiotics.  No new supplements etc. Family history negative for GI disease.  Review of Systems Pertinent positive and negative review of systems were noted in the above HPI section.  All other review of systems was otherwise negative.  Outpatient Encounter Medications as of 03/18/2018  Medication Sig  . albuterol (PROAIR HFA) 108 (90 Base) MCG/ACT inhaler Inhale 2 puffs into the lungs every 4 (four) hours as needed for wheezing or shortness of breath.  . budesonide-formoterol (SYMBICORT) 80-4.5 MCG/ACT inhaler Inhale 2 puffs into the lungs 2 (two) times daily. (Patient taking differently: Inhale 2 puffs into the lungs daily. )  . Maca Root POWD by Does not apply route daily.  . Na Sulfate-K Sulfate-Mg Sulf 17.5-3.13-1.6 GM/177ML SOLN Take as directed for colonoscopy.  . [DISCONTINUED] Cholecalciferol (VITAMIN D PO) Take 5,000 Units by mouth daily.  . [DISCONTINUED] Cyanocobalamin (VITAMIN B 12 PO) Take by mouth daily.  . [DISCONTINUED] mometasone (ELOCON) 0.1 % cream APPLY TO AFFECTED AREAS ONCE DAILY IF NEEDED  . [DISCONTINUED] montelukast (SINGULAIR) 10 MG tablet Take 1 tablet (10 mg total) by mouth at bedtime. TAKE ONE TABLET ONCE DAILY AS DIRECTED  . [DISCONTINUED] Multiple Vitamin (MULTIVITAMIN) tablet Take 1 tablet by mouth daily.  . [DISCONTINUED] vitamin C (ASCORBIC ACID) 500 MG tablet Take 500 mg by mouth daily.  No facility-administered encounter medications on file as of 03/18/2018.    Allergies  Allergen Reactions  . Codeine     REACTION: nausea   Patient Active Problem List   Diagnosis Date Noted  . Adverse food reaction 01/22/2018  . Mild persistent asthma, uncomplicated 27/25/3664  . Seasonal and perennial allergic rhinitis 01/22/2018  . Asthma 02/13/2017  . Vaginitis and vulvovaginitis 04/15/2013  . H/O vitamin D deficiency 04/15/2013  . Depression 02/07/2012    . Anxiety 02/07/2012  . Weight disorder 02/07/2012  . Candidiasis, intertrigo 06/12/2011  . Asthma   . Migraine   . DYSPEPSIA&OTHER [place] DISORDERS FUNCTION STOMACH 05/22/2009  . SINUSITIS- ACUTE-NOS 08/19/2007  . ASTHMA, CHRONIC OBSTRUCTIVE NOS 04/06/2007  . HYPERTENSION 02/26/2007   Social History   Socioeconomic History  . Marital status: Married    Spouse name: Not on file  . Number of children: Not on file  . Years of education: Not on file  . Highest education level: Not on file  Occupational History  . Not on file  Social Needs  . Financial resource strain: Not on file  . Food insecurity:    Worry: Not on file    Inability: Not on file  . Transportation needs:    Medical: Not on file    Non-medical: Not on file  Tobacco Use  . Smoking status: Never Smoker  . Smokeless tobacco: Never Used  Substance and Sexual Activity  . Alcohol use: No    Comment:    . Drug use: No  . Sexual activity: Yes    Birth control/protection: Other-see comments    Comment: PATIENTS PARTNER WITH VASECTOMY  Lifestyle  . Physical activity:    Days per week: Not on file    Minutes per session: Not on file  . Stress: Not on file  Relationships  . Social connections:    Talks on phone: Not on file    Gets together: Not on file    Attends religious service: Not on file    Active member of club or organization: Not on file    Attends meetings of clubs or organizations: Not on file    Relationship status: Not on file  . Intimate partner violence:    Fear of current or ex partner: Not on file    Emotionally abused: Not on file    Physically abused: Not on file    Forced sexual activity: Not on file  Other Topics Concern  . Not on file  Social History Narrative  . Not on file    Marcia Davis's family history includes Allergic rhinitis in her brother and son; Asthma in her brother and son; Diabetes in her mother; Heart disease in her maternal grandmother; Hypertension in her  mother.      Objective:    Vitals:   03/18/18 1106  BP: 120/72  Pulse: 76    Physical Exam; well-developed Hispanic female in no acute distress, pleasant blood pressure 120/72 pulse 76, height 5 foot 2, weight 176, BMI 32.1.  HEENT; nontraumatic normocephalic EOMI PERRLA sclera anicteric, Oropharynx clear, Cardiovascular; regular rate and rhythm with S1-S2 no murmur rub or gallop, Pulmonary ;clear bilaterally, Abdomen; soft, she is tender in the epigastrium and right upper quadrant there is no guarding or rebound no palpable mass or hepatosplenomegaly bowel sounds are present, Rectal ;exam not done, Ext; no clubbing cyanosis or edema skin warm and dry, Neuro psych ;alert and oriented, grossly nonfocal mood and affect appropriate  Assessment & Plan:   #47 52 year old Hispanic female with 8-week history of intermittent episodes of upper abdominal pain described as pressure and distention sometimes radiating into the back usually occurring 30 minutes to an hour after eating and lasting a couple of hours.  These episodes are associated with nausea without vomiting, and then frequently develops very urgent diarrhea.  She had a severe episode this past weekend with multiple episodes of incontinent diarrhea. Generally after an episode she will have some upper abdominal soreness for a couple of days and is afraid to eat for fear of bringing on symptoms. She also reports feeling generally fatigued over the past couple of months. Etiology of her symptoms is not clear.  Prior ultrasound in 2012 was unremarkable. Symptoms possibly secondary to IBS though more severe than what is generally seen. Rule out biliary dyskinesia, rule out other intra-abdominal inflammatory process, rule out IBD.  Plan; CBC with differential, C met, sed rate CRP, GI pathogen panel and stool for lactoferrin Will schedule for upper abdominal ultrasound. If ultrasound is negative, consider CCK HIDA scan We will also  schedule for EGD and colonoscopy with Dr. Silverio Decamp. Patient is 63 and is indicated for screening colonoscopy as well.  Procedures were discussed in detail with the patient including indications risks and benefits and she is agreeable to proceed. I offered a trial of an antispasmodic to use on a as needed basis.  Patient states she is not a medicine person and does not want to take anything that she does not absolutely have to. Further plans pending results of above.  Juanmiguel Defelice S Marchele Decock PA-C 03/18/2018   Cc: No ref. provider found

## 2018-03-18 NOTE — Progress Notes (Signed)
Reviewed and agree with documentation and assessment and plan. K. Veena Nandigam , MD   

## 2018-03-18 NOTE — Patient Instructions (Addendum)
Your provider has requested that you go to the basement level for lab work  and stool study testing before leaving today. Press "B" on the elevator. The lab is located at the first door on the left as you exit the elevator.  You have been scheduled for an endoscopy and colonoscopy. Please follow the written instructions given to you at your visit today. Please pick up your prep supplies at the pharmacy within the next 1-3 days. If you use inhalers (even only as needed), please bring them with you on the day of your procedure. You have been [facility] of hospital) on Wed 6-12 at 7:00 am. Please arrive 15 minutes prior to your appointment for registration. Make certain not to have anything to eat or drink 6 hours prior to your appointment. Should you need to reschedule your appointment, please contact radiology at [phone]. This test typically takes about 30 minutes to perform.     If you are age 264 or younger, your body mass index should be between 19-25. Your Body mass index is 32.19 kg/m. If this is out of the aformentioned range listed, please consider follow up with your Primary Care Provider.

## 2018-03-19 ENCOUNTER — Other Ambulatory Visit: Payer: BLUE CROSS/BLUE SHIELD

## 2018-03-19 DIAGNOSIS — R197 Diarrhea, unspecified: Secondary | ICD-10-CM

## 2018-03-19 DIAGNOSIS — R1013 Epigastric pain: Secondary | ICD-10-CM

## 2018-03-19 DIAGNOSIS — R110 Nausea: Secondary | ICD-10-CM

## 2018-03-25 ENCOUNTER — Ambulatory Visit: Payer: BLUE CROSS/BLUE SHIELD | Admitting: Physician Assistant

## 2018-03-25 ENCOUNTER — Ambulatory Visit (HOSPITAL_COMMUNITY)
Admission: RE | Admit: 2018-03-25 | Discharge: 2018-03-25 | Disposition: A | Discharge status: Home / Self Care | Payer: BLUE CROSS/BLUE SHIELD | Source: Ambulatory Visit | Attending: Physician Assistant | Admitting: Physician Assistant

## 2018-03-25 DIAGNOSIS — R1013 Epigastric pain: Secondary | ICD-10-CM | POA: Insufficient documentation

## 2018-03-25 DIAGNOSIS — R197 Diarrhea, unspecified: Secondary | ICD-10-CM | POA: Insufficient documentation

## 2018-03-25 DIAGNOSIS — R110 Nausea: Secondary | ICD-10-CM | POA: Diagnosis not present

## 2018-03-26 LAB — FECAL LACTOFERRIN, QUANT
Fecal Lactoferrin: NEGATIVE
MICRO NUMBER:: 90681779
SPECIMEN QUALITY:: ADEQUATE

## 2018-03-26 LAB — GASTROINTESTINAL PATHOGEN PANEL PCR
C. difficile Tox A/B, PCR: NOT DETECTED
Campylobacter, PCR: NOT DETECTED
Cryptosporidium, PCR: NOT DETECTED
E coli (ETEC) LT/ST PCR: NOT DETECTED
E coli (STEC) stx1/stx2, PCR: NOT DETECTED
E coli 0157, PCR: NOT DETECTED
Giardia lamblia, PCR: NOT DETECTED
Norovirus, PCR: NOT DETECTED
Rotavirus A, PCR: NOT DETECTED
Salmonella, PCR: NOT DETECTED
Shigella, PCR: NOT DETECTED

## 2018-03-30 ENCOUNTER — Telehealth: Payer: Self-pay | Admitting: Physician Assistant

## 2018-03-30 NOTE — Telephone Encounter (Signed)
Patient states she is not feeling well. She is having abd pain and would like to speak to nurse. Pt is schedule for endo colon on 7.22.19.

## 2018-03-30 NOTE — Telephone Encounter (Signed)
Patient is still having a lot of abdominal cramping/bloating and diarrhea. She is willing to try the antispasmodic now. She also is wondering if she should be tested for SIBO. Please advise.

## 2018-03-31 ENCOUNTER — Other Ambulatory Visit: Payer: Self-pay

## 2018-03-31 ENCOUNTER — Encounter: Payer: Self-pay | Admitting: Physician Assistant

## 2018-03-31 ENCOUNTER — Telehealth: Payer: Self-pay | Admitting: Gastroenterology

## 2018-03-31 MED ORDER — GLYCOPYRROLATE 2 MG PO TABS: 2.0000 mg | ORAL_TABLET | Freq: Two times a day (BID) | ORAL | 4 refills | Status: DC

## 2018-03-31 NOTE — Telephone Encounter (Signed)
Lets try Robinul forte 2 mg po BID  #60 /4

## 2018-03-31 NOTE — Telephone Encounter (Signed)
Patient advised of recommendations and Rx sent to pharmacy.

## 2018-04-01 NOTE — Telephone Encounter (Signed)
Closed note by mistake. Please advise on patient's concerns.

## 2018-04-01 NOTE — Telephone Encounter (Signed)
I spoke with the patient. She is concerned about taking medication without a diagnosis to connect to her symptoms.  She has been keeping a food diary. Her symptoms of bloating and sometimes diarrhea occur when she eats fresh fruits or vegetables. The prescription for Robinul Forte has not been tried. She asks if the symptoms could be due to bacterial overgrowth? Is there any test that would help to get a firm diagnosis?

## 2018-04-01 NOTE — Telephone Encounter (Signed)
Ok to go ahead with SIBO testing, lactulose breath test. Please schedule a office visit with me soon and also see if we can move her procedure date if she would like to come in earlier. Thanks

## 2018-04-01 NOTE — Telephone Encounter (Signed)
Left information for her on the voicemail. Will call her tomorrow to discuss further.

## 2018-04-02 NOTE — Telephone Encounter (Signed)
Test kit at the front desk for the patient to pick up. She understands the instructions and phone number to [employer] are in the box.The instructions must be followed exactly.

## 2018-04-08 DIAGNOSIS — M5135 Other intervertebral disc degeneration, thoracolumbar region: Secondary | ICD-10-CM | POA: Diagnosis not present

## 2018-04-08 DIAGNOSIS — M9903 Segmental and somatic dysfunction of lumbar region: Secondary | ICD-10-CM | POA: Diagnosis not present

## 2018-04-08 DIAGNOSIS — M5134 Other intervertebral disc degeneration, thoracic region: Secondary | ICD-10-CM | POA: Diagnosis not present

## 2018-04-08 DIAGNOSIS — M5136 Other intervertebral disc degeneration, lumbar region: Secondary | ICD-10-CM | POA: Diagnosis not present

## 2018-04-11 DIAGNOSIS — R197 Diarrhea, unspecified: Secondary | ICD-10-CM | POA: Diagnosis not present

## 2018-04-11 DIAGNOSIS — R1013 Epigastric pain: Secondary | ICD-10-CM | POA: Diagnosis not present

## 2018-04-11 DIAGNOSIS — R110 Nausea: Secondary | ICD-10-CM | POA: Diagnosis not present

## 2018-05-04 ENCOUNTER — Encounter: Payer: BLUE CROSS/BLUE SHIELD | Admitting: Gastroenterology

## 2018-05-06 DIAGNOSIS — M5135 Other intervertebral disc degeneration, thoracolumbar region: Secondary | ICD-10-CM | POA: Diagnosis not present

## 2018-05-06 DIAGNOSIS — M9903 Segmental and somatic dysfunction of lumbar region: Secondary | ICD-10-CM | POA: Diagnosis not present

## 2018-05-06 DIAGNOSIS — M5136 Other intervertebral disc degeneration, lumbar region: Secondary | ICD-10-CM | POA: Diagnosis not present

## 2018-05-06 DIAGNOSIS — M5134 Other intervertebral disc degeneration, thoracic region: Secondary | ICD-10-CM | POA: Diagnosis not present

## 2018-05-08 ENCOUNTER — Other Ambulatory Visit: Payer: Self-pay

## 2018-05-08 ENCOUNTER — Telehealth: Payer: Self-pay | Admitting: Gastroenterology

## 2018-05-08 MED ORDER — RIFAXIMIN 550 MG PO TABS: 550.0000 mg | ORAL_TABLET | Freq: Three times a day (TID) | ORAL | 0 refills | Status: DC

## 2018-05-08 NOTE — Telephone Encounter (Signed)
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 Thanks

## 2018-05-08 NOTE — Telephone Encounter (Signed)
Left a message to call back.

## 2018-05-11 ENCOUNTER — Other Ambulatory Visit: Payer: Self-pay

## 2018-05-11 NOTE — Telephone Encounter (Signed)
Left this information on her cell phone voicemail.

## 2018-05-11 NOTE — Telephone Encounter (Signed)
Patient advised of her results.  She has altered her diet "significantly." Periods of fasting. Complete avoidance of some foods.  She asks if she could have put the issue into remission this way. Free of GI symptoms. Please advise.

## 2018-05-11 NOTE — Telephone Encounter (Signed)
Glad that her symptoms have resolved with dietary changes, we can hold antibiotics. Its hard to predict if she would have a recurrence or not, please advise patient to contact if symptoms recur.

## 2018-06-03 DIAGNOSIS — M5134 Other intervertebral disc degeneration, thoracic region: Secondary | ICD-10-CM | POA: Diagnosis not present

## 2018-06-03 DIAGNOSIS — M9903 Segmental and somatic dysfunction of lumbar region: Secondary | ICD-10-CM | POA: Diagnosis not present

## 2018-06-03 DIAGNOSIS — M5136 Other intervertebral disc degeneration, lumbar region: Secondary | ICD-10-CM | POA: Diagnosis not present

## 2018-06-03 DIAGNOSIS — M5135 Other intervertebral disc degeneration, thoracolumbar region: Secondary | ICD-10-CM | POA: Diagnosis not present

## 2018-06-19 IMAGING — MG 2D DIGITAL SCREENING BILATERAL MAMMOGRAM WITH CAD AND ADJUNCT TO
9 of 10 series · 9 of 18 positions shown · non-contrast
Comparison: Previous exam(s).

CLINICAL DATA: Screening.

EXAM:
2D DIGITAL SCREENING BILATERAL MAMMOGRAM WITH CAD AND ADJUNCT TOMO

[L CC]
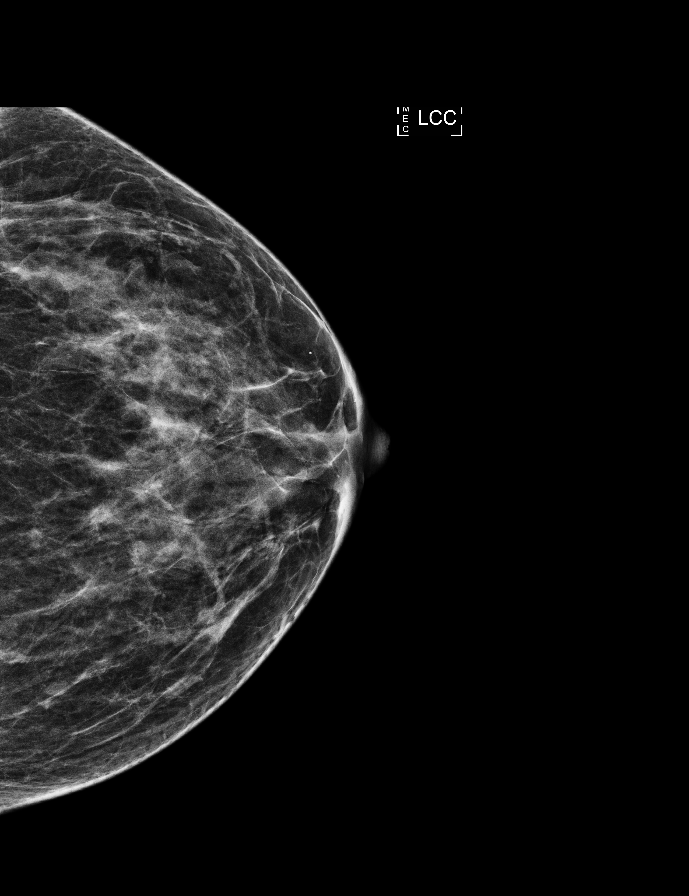

[L MLO synth-2D]
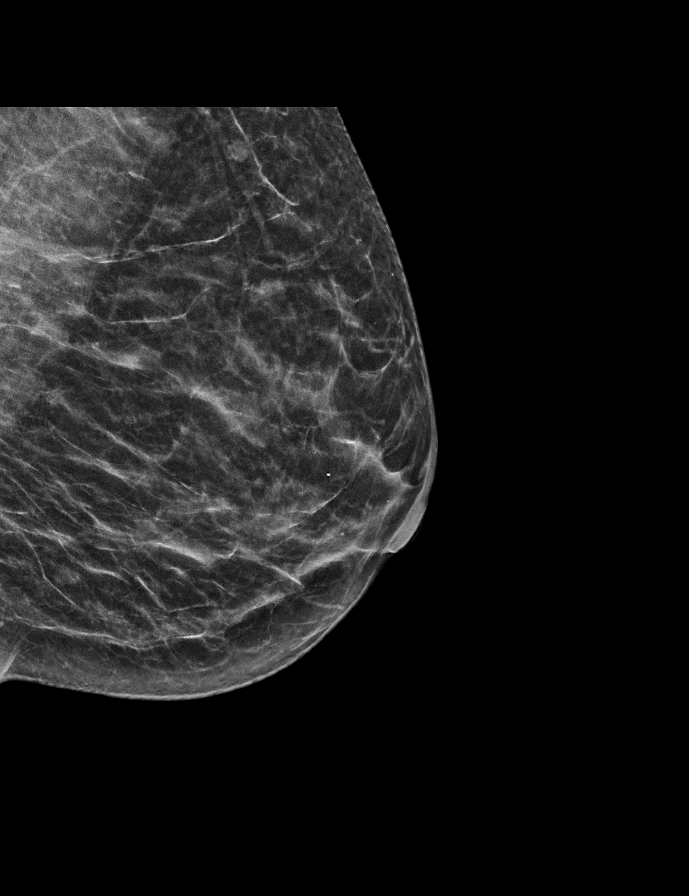

[R MLO synth-2D]
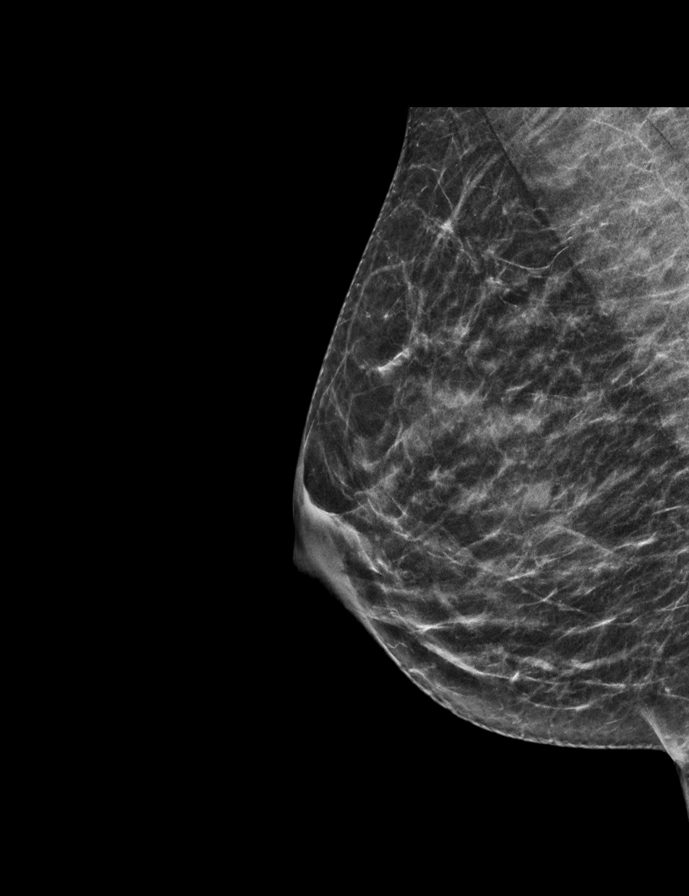

[R CC synth-2D]
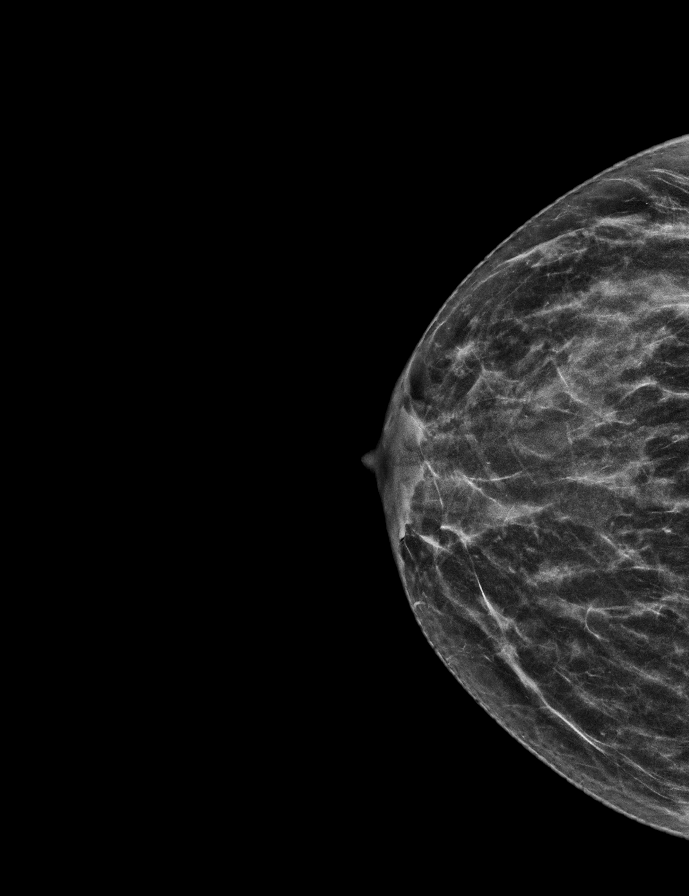

[R CC]
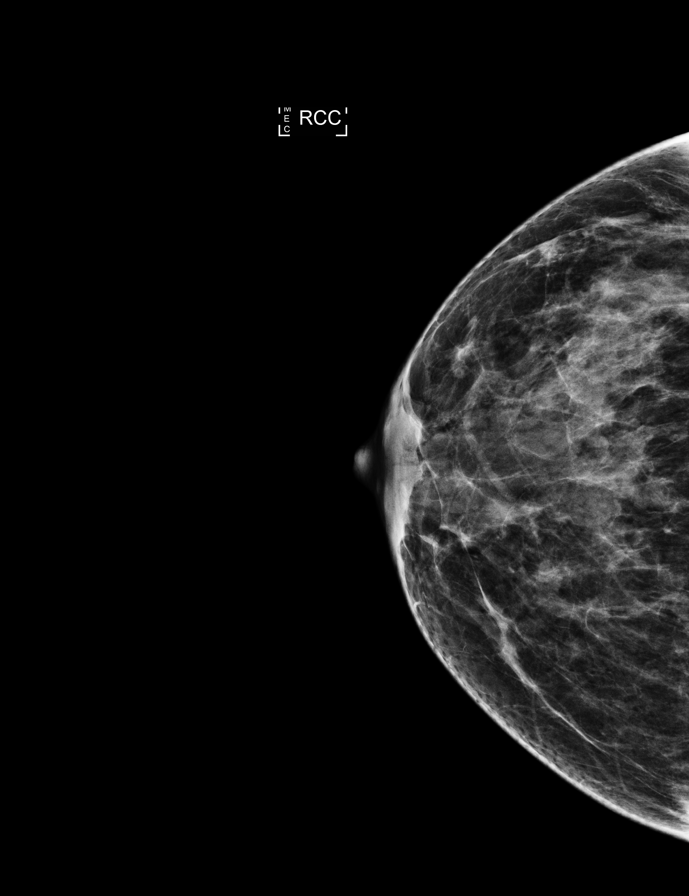

[L CC synth-2D]
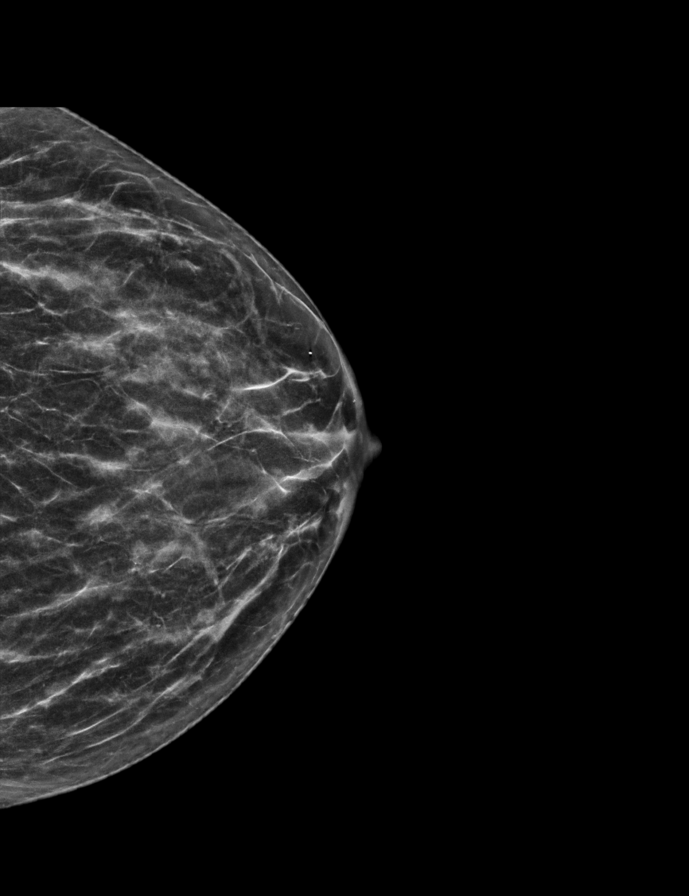

[R MLO]
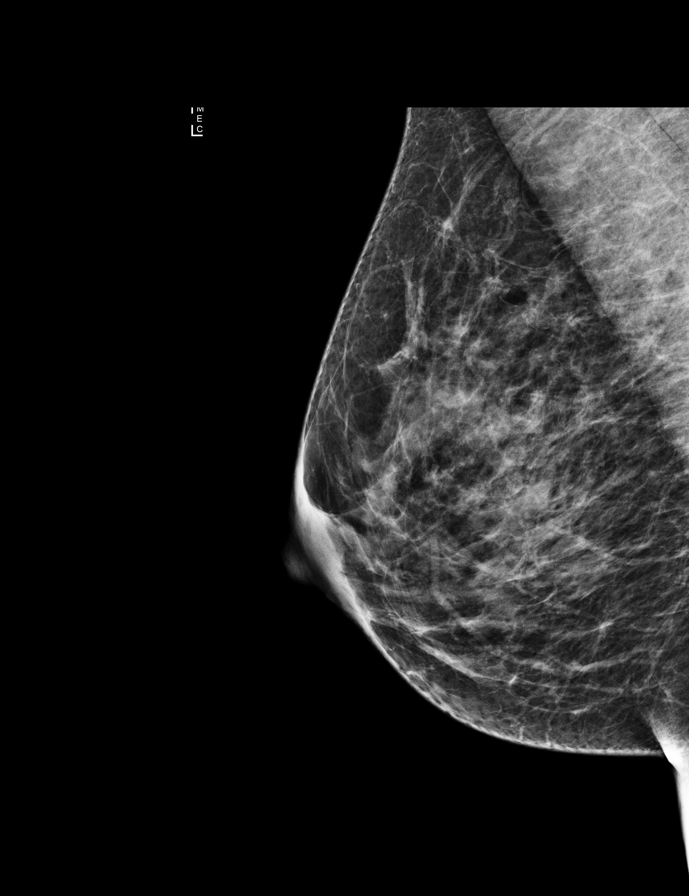

[L MLO]
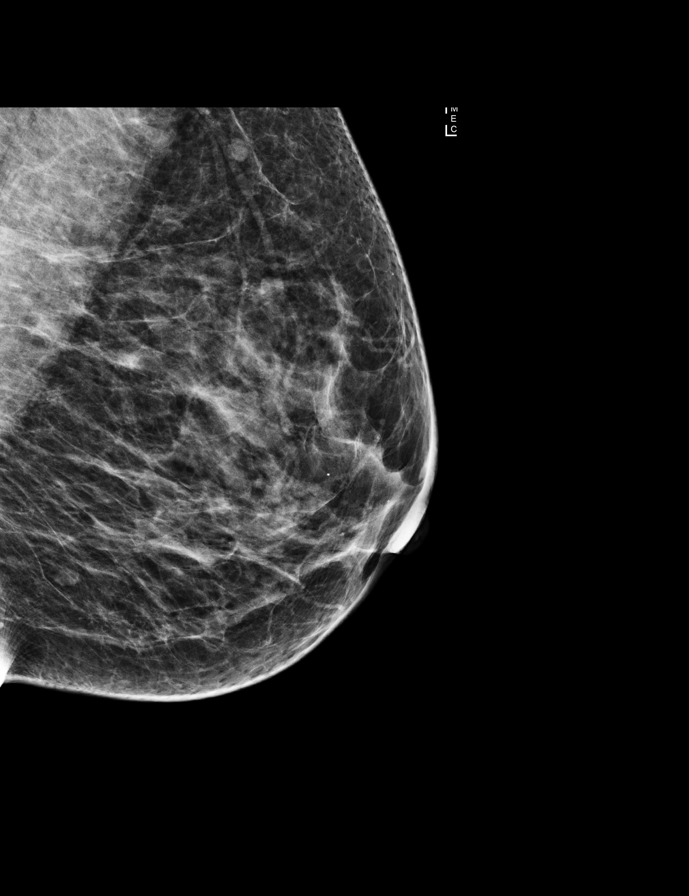

[R CC tomo · tomo slice 23/46.0]
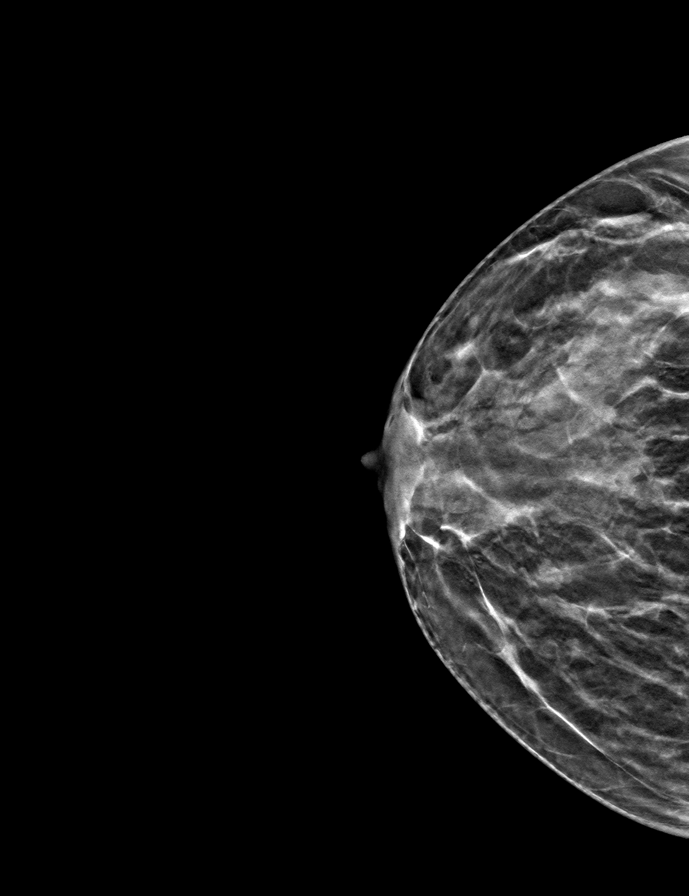

[9 of 18 positions shown; findings below may reference images not displayed]

ACR Breast Density Category c: The breast tissue is heterogeneously
dense, which may obscure small masses.
FINDINGS: There are no findings suspicious for malignancy. Images were
processed with CAD.
IMPRESSION: No mammographic evidence of malignancy. A result letter of this
screening mammogram will be mailed directly to the patient.

RECOMMENDATION:
Screening mammogram in one year. (Code:[id number])

BI-RADS CATEGORY  1: Negative.

## 2018-06-22 ENCOUNTER — Encounter: Payer: BLUE CROSS/BLUE SHIELD | Admitting: Gastroenterology

## 2018-07-01 DIAGNOSIS — M5136 Other intervertebral disc degeneration, lumbar region: Secondary | ICD-10-CM | POA: Diagnosis not present

## 2018-07-01 DIAGNOSIS — M5134 Other intervertebral disc degeneration, thoracic region: Secondary | ICD-10-CM | POA: Diagnosis not present

## 2018-07-01 DIAGNOSIS — M5135 Other intervertebral disc degeneration, thoracolumbar region: Secondary | ICD-10-CM | POA: Diagnosis not present

## 2018-07-01 DIAGNOSIS — M9903 Segmental and somatic dysfunction of lumbar region: Secondary | ICD-10-CM | POA: Diagnosis not present

## 2018-08-05 DIAGNOSIS — M5134 Other intervertebral disc degeneration, thoracic region: Secondary | ICD-10-CM | POA: Diagnosis not present

## 2018-08-05 DIAGNOSIS — M9903 Segmental and somatic dysfunction of lumbar region: Secondary | ICD-10-CM | POA: Diagnosis not present

## 2018-08-05 DIAGNOSIS — M5136 Other intervertebral disc degeneration, lumbar region: Secondary | ICD-10-CM | POA: Diagnosis not present

## 2018-08-05 DIAGNOSIS — M5135 Other intervertebral disc degeneration, thoracolumbar region: Secondary | ICD-10-CM | POA: Diagnosis not present

## 2018-08-07 ENCOUNTER — Other Ambulatory Visit: Payer: Self-pay

## 2018-08-07 MED ORDER — BUDESONIDE-FORMOTEROL FUMARATE 80-4.5 MCG/ACT IN AERO: 2.0000 | INHALATION_SPRAY | Freq: Two times a day (BID) | RESPIRATORY_TRACT | 3 refills | Status: DC

## 2018-08-18 ENCOUNTER — Telehealth: Payer: Self-pay

## 2018-08-18 NOTE — Telephone Encounter (Signed)
Patient called stating she spoke to a nurse last week and the nurse was supposed to send her a coupon for symbicort.  Please Advise.

## 2018-08-18 NOTE — Telephone Encounter (Signed)
Left message to return call rx was sent in with coupon info on it patient needs to contact pharmacy

## 2018-08-21 NOTE — Telephone Encounter (Signed)
Called patient and received no answer. Unable to leave a message.

## 2018-08-24 DIAGNOSIS — M9903 Segmental and somatic dysfunction of lumbar region: Secondary | ICD-10-CM | POA: Diagnosis not present

## 2018-08-24 DIAGNOSIS — M5134 Other intervertebral disc degeneration, thoracic region: Secondary | ICD-10-CM | POA: Diagnosis not present

## 2018-08-24 DIAGNOSIS — M5135 Other intervertebral disc degeneration, thoracolumbar region: Secondary | ICD-10-CM | POA: Diagnosis not present

## 2018-08-24 DIAGNOSIS — M5136 Other intervertebral disc degeneration, lumbar region: Secondary | ICD-10-CM | POA: Diagnosis not present

## 2018-08-24 NOTE — Telephone Encounter (Signed)
Per patient this issue has been resolved.

## 2018-09-02 DIAGNOSIS — M5136 Other intervertebral disc degeneration, lumbar region: Secondary | ICD-10-CM | POA: Diagnosis not present

## 2018-09-02 DIAGNOSIS — M5135 Other intervertebral disc degeneration, thoracolumbar region: Secondary | ICD-10-CM | POA: Diagnosis not present

## 2018-09-02 DIAGNOSIS — M5134 Other intervertebral disc degeneration, thoracic region: Secondary | ICD-10-CM | POA: Diagnosis not present

## 2018-09-02 DIAGNOSIS — M9903 Segmental and somatic dysfunction of lumbar region: Secondary | ICD-10-CM | POA: Diagnosis not present

## 2018-09-16 DIAGNOSIS — M9903 Segmental and somatic dysfunction of lumbar region: Secondary | ICD-10-CM | POA: Diagnosis not present

## 2018-09-16 DIAGNOSIS — M5135 Other intervertebral disc degeneration, thoracolumbar region: Secondary | ICD-10-CM | POA: Diagnosis not present

## 2018-09-16 DIAGNOSIS — M5134 Other intervertebral disc degeneration, thoracic region: Secondary | ICD-10-CM | POA: Diagnosis not present

## 2018-09-16 DIAGNOSIS — M5136 Other intervertebral disc degeneration, lumbar region: Secondary | ICD-10-CM | POA: Diagnosis not present

## 2018-10-02 DIAGNOSIS — Z202 Contact with and (suspected) exposure to infections with a predominantly sexual mode of transmission: Secondary | ICD-10-CM | POA: Diagnosis not present

## 2018-10-02 DIAGNOSIS — N898 Other specified noninflammatory disorders of vagina: Secondary | ICD-10-CM | POA: Diagnosis not present

## 2018-10-02 DIAGNOSIS — N910 Primary amenorrhea: Secondary | ICD-10-CM | POA: Diagnosis not present

## 2018-11-02 DIAGNOSIS — M5136 Other intervertebral disc degeneration, lumbar region: Secondary | ICD-10-CM | POA: Diagnosis not present

## 2018-11-02 DIAGNOSIS — M5135 Other intervertebral disc degeneration, thoracolumbar region: Secondary | ICD-10-CM | POA: Diagnosis not present

## 2018-11-02 DIAGNOSIS — M5134 Other intervertebral disc degeneration, thoracic region: Secondary | ICD-10-CM | POA: Diagnosis not present

## 2018-11-02 DIAGNOSIS — M9903 Segmental and somatic dysfunction of lumbar region: Secondary | ICD-10-CM | POA: Diagnosis not present

## 2018-11-11 DIAGNOSIS — F331 Major depressive disorder, recurrent, moderate: Secondary | ICD-10-CM | POA: Diagnosis not present

## 2018-11-17 DIAGNOSIS — L821 Other seborrheic keratosis: Secondary | ICD-10-CM | POA: Diagnosis not present

## 2018-11-17 DIAGNOSIS — D229 Melanocytic nevi, unspecified: Secondary | ICD-10-CM | POA: Diagnosis not present

## 2018-11-19 LAB — HM HIV SCREENING LAB: HM HIV Screening: NEGATIVE

## 2018-11-19 LAB — HM MAMMOGRAPHY: HM Mammogram: NORMAL (ref 0–4)

## 2018-11-19 LAB — HM PAP SMEAR

## 2018-12-03 DIAGNOSIS — Z1389 Encounter for screening for other disorder: Secondary | ICD-10-CM | POA: Diagnosis not present

## 2018-12-03 DIAGNOSIS — Z124 Encounter for screening for malignant neoplasm of cervix: Secondary | ICD-10-CM | POA: Diagnosis not present

## 2018-12-03 DIAGNOSIS — Z01419 Encounter for gynecological examination (general) (routine) without abnormal findings: Secondary | ICD-10-CM | POA: Diagnosis not present

## 2018-12-03 DIAGNOSIS — Z1151 Encounter for screening for human papillomavirus (HPV): Secondary | ICD-10-CM | POA: Diagnosis not present

## 2018-12-03 DIAGNOSIS — Z13228 Encounter for screening for other metabolic disorders: Secondary | ICD-10-CM | POA: Diagnosis not present

## 2018-12-03 DIAGNOSIS — Z113 Encounter for screening for infections with a predominantly sexual mode of transmission: Secondary | ICD-10-CM | POA: Diagnosis not present

## 2018-12-03 DIAGNOSIS — Z130 Encounter for screening for diseases of the blood and blood-forming organs and certain disorders involving the immune mechanism: Secondary | ICD-10-CM | POA: Diagnosis not present

## 2018-12-03 DIAGNOSIS — Z1231 Encounter for screening mammogram for malignant neoplasm of breast: Secondary | ICD-10-CM | POA: Diagnosis not present

## 2018-12-05 DIAGNOSIS — F4322 Adjustment disorder with anxiety: Secondary | ICD-10-CM | POA: Diagnosis not present

## 2018-12-07 DIAGNOSIS — M9903 Segmental and somatic dysfunction of lumbar region: Secondary | ICD-10-CM | POA: Diagnosis not present

## 2018-12-07 DIAGNOSIS — M5135 Other intervertebral disc degeneration, thoracolumbar region: Secondary | ICD-10-CM | POA: Diagnosis not present

## 2018-12-07 DIAGNOSIS — M5136 Other intervertebral disc degeneration, lumbar region: Secondary | ICD-10-CM | POA: Diagnosis not present

## 2018-12-07 DIAGNOSIS — M5134 Other intervertebral disc degeneration, thoracic region: Secondary | ICD-10-CM | POA: Diagnosis not present

## 2019-01-07 ENCOUNTER — Encounter: Payer: Self-pay | Admitting: *Deleted

## 2019-01-12 ENCOUNTER — Telehealth: Payer: Self-pay | Admitting: Allergy

## 2019-01-12 NOTE — Telephone Encounter (Signed)
Patient left message stating that her allergies are really bad.   Can you please call patient and ask if she would she like to set up a televisit? Last seen in April 2019 by Dewayne Hatch.  Thank you.

## 2019-01-12 NOTE — Telephone Encounter (Signed)
Spoke to patient televisit scheduled with Dr Selena Batten 01/13/2019 @ 9 am

## 2019-01-13 ENCOUNTER — Encounter: Payer: Self-pay | Admitting: Allergy

## 2019-01-13 ENCOUNTER — Ambulatory Visit (INDEPENDENT_AMBULATORY_CARE_PROVIDER_SITE_OTHER): Payer: BLUE CROSS/BLUE SHIELD | Admitting: Allergy

## 2019-01-13 ENCOUNTER — Other Ambulatory Visit: Payer: Self-pay

## 2019-01-13 DIAGNOSIS — L2089 Other atopic dermatitis: Secondary | ICD-10-CM | POA: Diagnosis not present

## 2019-01-13 DIAGNOSIS — H1010 Acute atopic conjunctivitis, unspecified eye: Secondary | ICD-10-CM | POA: Diagnosis not present

## 2019-01-13 DIAGNOSIS — J4530 Mild persistent asthma, uncomplicated: Secondary | ICD-10-CM

## 2019-01-13 DIAGNOSIS — J3089 Other allergic rhinitis: Secondary | ICD-10-CM

## 2019-01-13 DIAGNOSIS — J302 Other seasonal allergic rhinitis: Secondary | ICD-10-CM

## 2019-01-13 MED ORDER — OLOPATADINE HCL 0.7 % OP SOLN: 1.0000 [drp] | Freq: Every day | OPHTHALMIC | 1 refills | Status: DC | PRN

## 2019-01-13 MED ORDER — MONTELUKAST SODIUM 10 MG PO TABS: 10.0000 mg | ORAL_TABLET | Freq: Every day | ORAL | 2 refills | Status: DC

## 2019-01-13 MED ORDER — CARBINOXAMINE MALEATE 6 MG PO TABS: 1.0000 | ORAL_TABLET | Freq: Every evening | ORAL | 1 refills | Status: DC | PRN

## 2019-01-13 NOTE — Assessment & Plan Note (Signed)
Slightly worsening the last 2 weeks since spending more time outdoors. Has not needed to use albuterol. . Daily controller medication(s):   o Continue Symbicort 80 2 puffs twice a day and rinse mouth afterwards. o May decrease to Symbicort 80 2 puffs once a day if doing better. o Start Singulair 10 mg daily at night. . Prior to physical activity: May use albuterol rescue inhaler 2 puffs 5 to 15 minutes prior to strenuous physical activities. Marland Kitchen Rescue medications: May use albuterol rescue inhaler 2 puffs or nebulizer every 4 to 6 hours as needed for shortness of breath, chest tightness, coughing, and wheezing. Monitor frequency of use.  . During upper respiratory infections: Start Symbicort 80 2 puffs twice a day for 1 to 2 weeks.

## 2019-01-13 NOTE — Assessment & Plan Note (Signed)
Well-controlled.   Continue skin care regimen. 

## 2019-01-13 NOTE — Progress Notes (Addendum)
RE: Marcia Davis MRN: [id number] DOB: 1966/07/09 Date of Telemedicine Visit: 01/13/2019  Referring provider: No ref. provider found Primary care provider: Patient, No Pcp Per  Chief Complaint: Allergies   Telemedicine Follow Up Visit via Telephone: I connected with Nahiara Eckberg for a follow up on 01/13/19 by telephone and verified that I am speaking with the correct person using two identifiers.   I discussed the limitations, risks, security and privacy concerns of performing an evaluation and management service by telephone and the availability of in person appointments. I also discussed with the patient that there may be a patient responsible charge related to this service. The patient expressed understanding and agreed to proceed.  Patient is at home. Provider is at the office.  Visit start time: 9:11AM Visit end time: 9:30 AM Insurance consent/check in by: Doreen Beam Medical consent and medical assistant/nurse: Cristela Felt  History of Present Illness: She is a 53 y.o. female, who is being followed for asthma, allergic rhinitis, food allergies. Her previous allergy office visit was on 01/22/2018 with Thermon Leyland, FNP.   Mild persistent asthma, uncomplicated The last 2 weeks her asthma has not been as well controlled and is having some coughing. She had a sinus infection in March and the cough has been lingering.  This usually happens after sinus infection and the cough can linger up to 1 month.  She usually takes Symbicort 82 puffs once a day but the last 1-2 weeks she has been using it twice a day.  Denies any albuterol rescue inhaler use.  Patient has been spending more time outdoors now that she is working from home which seems to trigger her asthma symptoms.   No ER/urgent care visits or prednisone use since the last visit.   Allergic rhinitis No issues with the eyes yet and has been using saline rinses with good benefit. She has been using Zyrtec in the mornings  however noticing that is not lasting the full 24 hours.  She also noted insomnia if she takes the medication at night.  Intrinsic atopic dermatitis No issues.   Assessment and Plan: Marvie is a 53 y.o. female with: Mild persistent asthma, uncomplicated Slightly worsening the last 2 weeks since spending more time outdoors. Has not needed to use albuterol. . Daily controller medication(s):   o Continue Symbicort 80 2 puffs twice a day and rinse mouth afterwards. o May decrease to Symbicort 80 2 puffs once a day if doing better. o Start Singulair 10 mg daily at night. . Prior to physical activity: May use albuterol rescue inhaler 2 puffs 5 to 15 minutes prior to strenuous physical activities. Marland Kitchen Rescue medications: May use albuterol rescue inhaler 2 puffs or nebulizer every 4 to 6 hours as needed for shortness of breath, chest tightness, coughing, and wheezing. Monitor frequency of use.  . During upper respiratory infections: Start Symbicort 80 2 puffs twice a day for 1 to 2 weeks.  Seasonal and perennial allergic rhinoconjunctivitis Past history - 2018 skin testing was positive to grass, weed, tree, dust mites. Interim history - increased symptoms with increased pollen in the air.  Start Singulair 10 mg daily at night.  May use over-the-counter antihistamine such as Zyrtec 10 mg daily in the morning.  Try RyVent 1 tablet at night as needed for allergic symptoms.  If this is not covered by insurance then use Benadryl 25 mg instead.  May use Pazeo 1 drop in each eye as needed daily for itchy eyes.  Continue environmental  control measures.  Other atopic dermatitis Well-controlled.  Continue skin care regimen.  Return in about 4 months (around 05/15/2019).  Meds ordered this encounter  Medications  . montelukast (SINGULAIR) 10 MG tablet    Sig: Take 1 tablet (10 mg total) by mouth at bedtime.    Dispense:  90 tablet    Refill:  2  . Carbinoxamine Maleate (RYVENT) 6 MG TABS     Sig: Take 1 tablet by mouth at bedtime as needed.    Dispense:  90 tablet    Refill:  1  . Olopatadine HCl (PAZEO) 0.7 % SOLN    Sig: Apply 1 drop to eye daily as needed (itchy eyes).    Dispense:  3 Bottle    Refill:  1   Diagnostics: None.  Medication List:  Current Outpatient Medications  Medication Sig Dispense Refill  . albuterol (PROAIR HFA) 108 (90 Base) MCG/ACT inhaler Inhale 2 puffs into the lungs every 4 (four) hours as needed for wheezing or shortness of breath. 3 Inhaler 1  . budesonide-formoterol (SYMBICORT) 80-4.5 MCG/ACT inhaler Inhale 2 puffs into the lungs 2 (two) times daily. 3 Inhaler 3  . glycopyrrolate (ROBINUL) 2 MG tablet Take 1 tablet (2 mg total) by mouth 2 (two) times daily. 60 tablet 4  . Maca Root POWD by Does not apply route daily.    . Na Sulfate-K Sulfate-Mg Sulf 17.5-3.13-1.6 GM/177ML SOLN Take as directed for colonoscopy. 354 mL 0  . Carbinoxamine Maleate (RYVENT) 6 MG TABS Take 1 tablet by mouth at bedtime as needed. 90 tablet 1  . montelukast (SINGULAIR) 10 MG tablet Take 1 tablet (10 mg total) by mouth at bedtime. 90 tablet 2  . Olopatadine HCl (PAZEO) 0.7 % SOLN Apply 1 drop to eye daily as needed (itchy eyes). 3 Bottle 1   No current facility-administered medications for this visit.    Allergies: Allergies  Allergen Reactions  . Codeine     REACTION: nausea   I reviewed her past medical history, social history, family history, and environmental history and no significant changes have been reported from previous visit on 01/22/2018.  Review of Systems  Constitutional: Negative for appetite change, chills, fever and unexpected weight change.  HENT: Negative for congestion and rhinorrhea.   Eyes: Negative for itching.  Respiratory: Negative for cough, chest tightness, shortness of breath and wheezing.   Gastrointestinal: Negative for abdominal pain.  Skin: Negative for rash.  Neurological: Negative for headaches.   Objective: Physical Exam  Not obtained as encounter was done via telephone.   Previous notes and tests were reviewed.  I discussed the assessment and treatment plan with the patient. The patient was provided an opportunity to ask questions and all were answered. The patient agreed with the plan and demonstrated an understanding of the instructions. After visit summary/patient instructions available via mychart.   The patient was advised to call back or seek an in-person evaluation if the symptoms worsen or if the condition fails to improve as anticipated.  I provided 19 minutes of non-face-to-face time during this encounter.  It was my pleasure to participate in Williamson Zellman's care today. Please feel free to contact me with any questions or concerns.   Sincerely,  Wyline Mood, DO Allergy & Immunology  [facility] office: [phone] [facility] office: [phone]

## 2019-01-13 NOTE — Patient Instructions (Addendum)
Mild persistent asthma, uncomplicated . Daily controller medication(s):   o Continue Symbicort 80 2 puffs twice a day and rinse mouth afterwards. o May decrease to Symbicort 80 2 puffs once a day if doing better. o Start Singulair 10 mg daily at night. . Prior to physical activity: May use albuterol rescue inhaler 2 puffs 5 to 15 minutes prior to strenuous physical activities. Marland Kitchen Rescue medications: May use albuterol rescue inhaler 2 puffs or nebulizer every 4 to 6 hours as needed for shortness of breath, chest tightness, coughing, and wheezing. Monitor frequency of use.  . During upper respiratory infections: Start Symbicort 80 2 puffs twice a day for 1 to 2 weeks. . Asthma control goals:  o Full participation in all desired activities (may need albuterol before activity) o Albuterol use two times or less a week on average (not counting use with activity) o Cough interfering with sleep two times or less a month o Oral steroids no more than once a year o No hospitalizations  Allergic rhinitis   Start Singulair 10 mg daily at night.  May use over-the-counter antihistamine such as Zyrtec 10 mg daily in the morning.  Try RyVent 1 tablet at night as needed for allergic symptoms.  If this is not covered by insurance then use Benadryl 25 mg instead.  May use Pazeo 1 drop in each eye as needed daily for itchy eyes.  Continue environmental control measures.  Atopic dermatitis  Continue skin care regimen.  Follow-up in 4 months.  Skin care recommendations  Bath time: . Always use lukewarm water. AVOID very hot or cold water. Marland Kitchen Keep bathing time to 5-10 minutes. . Do NOT use bubble bath. . Use a mild soap and use just enough to wash the dirty areas. . Do NOT scrub skin vigorously.  . After bathing, pat dry your skin with a towel. Do NOT rub or scrub the skin.  Moisturizers and prescriptions:  . ALWAYS apply moisturizers immediately after bathing (within 3 minutes). This helps to  lock-in moisture. . Use the moisturizer several times a day over the whole body. Peri [place] summer moisturizers include: Aveeno, CeraVe, Cetaphil. Peri [place] winter moisturizers include: Aquaphor, Vaseline, Cerave, Cetaphil, Eucerin, Vanicream. . When using moisturizers along with medications, the moisturizer should be applied about one hour after applying the medication to prevent diluting effect of the medication or moisturize around where you applied the medications. When not using medications, the moisturizer can be continued twice daily as maintenance.  Laundry and clothing: . Avoid laundry products with added color or perfumes. . Use unscented hypo-allergenic laundry products such as Tide free, Cheer free & gentle, and All free and clear.  . If the skin still seems dry or sensitive, you can try double-rinsing the clothes. . Avoid tight or scratchy clothing such as wool. . Do not use fabric softeners or dyer sheets.  Sincerely,  Wyline Mood, DO Allergy & Immunology  [facility] office: [phone] [facility] office: [phone]

## 2019-01-13 NOTE — Assessment & Plan Note (Signed)
Past history - 2018 skin testing was positive to grass, weed, tree, dust mites. Interim history - increased symptoms with increased pollen in the air.  Start Singulair 10 mg daily at night.  May use over-the-counter antihistamine such as Zyrtec 10 mg daily in the morning.  Try RyVent 1 tablet at night as needed for allergic symptoms.  If this is not covered by insurance then use Benadryl 25 mg instead.  May use Pazeo 1 drop in each eye as needed daily for itchy eyes.  Continue environmental control measures.

## 2019-04-08 ENCOUNTER — Telehealth: Payer: Self-pay | Admitting: Internal Medicine

## 2019-04-08 NOTE — Telephone Encounter (Signed)
Yes, I will see her  TJ 

## 2019-04-08 NOTE — Telephone Encounter (Signed)
Patient's husband Aneisha Skyles - [phone], a current patient of yours) called asking if you would be willing to see his wife as a new patient to establish care. Okay to schedule?

## 2019-04-09 NOTE — Telephone Encounter (Signed)
Appointment scheduled.

## 2019-04-12 ENCOUNTER — Ambulatory Visit (INDEPENDENT_AMBULATORY_CARE_PROVIDER_SITE_OTHER): Payer: BC Managed Care – PPO | Admitting: Internal Medicine

## 2019-04-12 ENCOUNTER — Other Ambulatory Visit: Payer: Self-pay

## 2019-04-12 ENCOUNTER — Other Ambulatory Visit (INDEPENDENT_AMBULATORY_CARE_PROVIDER_SITE_OTHER): Payer: BC Managed Care – PPO

## 2019-04-12 ENCOUNTER — Encounter: Payer: Self-pay | Admitting: Internal Medicine

## 2019-04-12 VITALS — BP 126/82 | HR 60 | Temp 98.2°F | Resp 16 | Ht 62.0 in | Wt 164.0 lb

## 2019-04-12 DIAGNOSIS — I10 Essential (primary) hypertension: Secondary | ICD-10-CM | POA: Diagnosis not present

## 2019-04-12 DIAGNOSIS — Z0000 Encounter for general adult medical examination without abnormal findings: Secondary | ICD-10-CM

## 2019-04-12 DIAGNOSIS — F331 Major depressive disorder, recurrent, moderate: Secondary | ICD-10-CM | POA: Diagnosis not present

## 2019-04-12 DIAGNOSIS — D539 Nutritional anemia, unspecified: Secondary | ICD-10-CM | POA: Diagnosis not present

## 2019-04-12 DIAGNOSIS — Z1211 Encounter for screening for malignant neoplasm of colon: Secondary | ICD-10-CM

## 2019-04-12 LAB — CBC WITH DIFFERENTIAL/PLATELET
Basophils Absolute: 0 10*3/uL (ref 0.0–0.1)
Basophils Relative: 0.6 % (ref 0.0–3.0)
Eosinophils Absolute: 0.1 10*3/uL (ref 0.0–0.7)
Eosinophils Relative: 1.7 % (ref 0.0–5.0)
HCT: 36 % (ref 36.0–46.0)
Hemoglobin: 11.5 g/dL — ABNORMAL LOW (ref 12.0–15.0)
Lymphocytes Relative: 36.4 % (ref 12.0–46.0)
Lymphs Abs: 2.3 10*3/uL (ref 0.7–4.0)
MCHC: 32 g/dL (ref 30.0–36.0)
MCV: 85.7 fl (ref 78.0–100.0)
Monocytes Absolute: 0.4 10*3/uL (ref 0.1–1.0)
Monocytes Relative: 5.9 % (ref 3.0–12.0)
Neutro Abs: 3.5 10*3/uL (ref 1.4–7.7)
Neutrophils Relative %: 55.4 % (ref 43.0–77.0)
Platelets: 196 10*3/uL (ref 150.0–400.0)
RBC: 4.2 Mil/uL (ref 3.87–5.11)
RDW: 14 % (ref 11.5–15.5)
WBC: 6.2 10*3/uL (ref 4.0–10.5)

## 2019-04-12 LAB — TSH: TSH: 1.12 u[IU]/mL (ref 0.35–4.50)

## 2019-04-12 MED ORDER — VIIBRYD STARTER PACK 10 & 20 MG PO KIT: 1.0000 | PACK | Freq: Every day | ORAL | 0 refills | Status: DC

## 2019-04-12 NOTE — Progress Notes (Signed)
Subjective:  Patient ID: Marcia Davis, female    DOB: 1965-11-24  Age: 53 y.o. MRN: [id number]  CC: Annual Exam   HPI Marcia Davis presents for a CPX.  She complains of a 77-monthhistory of signs and symptoms of depression and anxiety.  She recently discovered that her husband is a sex addict and she is very upset about it.  She complains of insomnia with frequent awakenings, lack of focus, weight loss, obsessive thoughts, crying spells, shaking spells and anhedonia.  She iha tried melatonin and CBD oil but has not gotten much symptom relief.  She was treated years ago for depression with Cymbalta and did not have a good response to it.  She has sought the help of two local therapists but has not had a good experience with either 1 of them.  She denies SI or HI.  She has a history of hypertension and controls her blood pressure with lifestyle modifications.  History MHilaryhas a past medical history of Anxiety, Asthma, Depression, Eczema, and Migraine.   She has a past surgical history that includes Endometrial ablation.   Her family history includes Allergic rhinitis in her brother and son; Asthma in her brother and son; Depression in her brother, mother, and sister; Diabetes in her mother; Heart disease in her maternal grandmother; Hypertension in her mother.She reports that she has never smoked. She has never used smokeless tobacco. She reports that she does not drink alcohol or use drugs.  Outpatient Medications Prior to Visit  Medication Sig Dispense Refill  . albuterol (PROAIR HFA) 108 (90 Base) MCG/ACT inhaler Inhale 2 puffs into the lungs every 4 (four) hours as needed for wheezing or shortness of breath. 3 Inhaler 1  . budesonide-formoterol (SYMBICORT) 80-4.5 MCG/ACT inhaler Inhale 2 puffs into the lungs 2 (two) times daily. 3 Inhaler 3  . Carbinoxamine Maleate (RYVENT) 6 MG TABS Take 1 tablet by mouth at bedtime as needed. 90 tablet 1  . montelukast (SINGULAIR) 10 MG tablet  Take 1 tablet (10 mg total) by mouth at bedtime. 90 tablet 2  . Maca Root POWD by Does not apply route daily.    . Na Sulfate-K Sulfate-Mg Sulf 17.5-3.13-1.6 GM/177ML SOLN Take as directed for colonoscopy. 354 mL 0  . glycopyrrolate (ROBINUL) 2 MG tablet Take 1 tablet (2 mg total) by mouth 2 (two) times daily. 60 tablet 4  . Olopatadine HCl (PAZEO) 0.7 % SOLN Apply 1 drop to eye daily as needed (itchy eyes). 3 Bottle 1   No facility-administered medications prior to visit.     ROS Review of Systems  Constitutional: Positive for appetite change, fatigue and unexpected weight change. Negative for chills, diaphoresis and fever.  HENT: Negative.   Eyes: Negative for visual disturbance.  Respiratory: Negative for cough, chest tightness, shortness of breath and wheezing.   Cardiovascular: Negative for chest pain, palpitations and leg swelling.  Gastrointestinal: Negative for abdominal pain, blood in stool, constipation, diarrhea and vomiting.  Endocrine: Negative.   Genitourinary: Negative.  Negative for difficulty urinating.  Musculoskeletal: Negative.   Skin: Negative.   Neurological: Negative.  Negative for dizziness, weakness, light-headedness, numbness and headaches.  Hematological: Negative for adenopathy. Does not bruise/bleed easily.  Psychiatric/Behavioral: Positive for agitation, decreased concentration, dysphoric mood and sleep disturbance. Negative for behavioral problems, confusion, hallucinations, self-injury and suicidal ideas. The patient is nervous/anxious. The patient is not hyperactive.     Objective:  BP 126/82   Pulse 60   Temp 98.2 F (36.8 C) (Oral)  Resp 16   Ht 5' 2"  (1.575 m)   Wt 164 lb (74.4 kg)   LMP 09/30/2018   SpO2 98%   BMI 30.00 kg/m   Physical Exam Vitals signs reviewed.  Constitutional:      Appearance: Normal appearance. She is not ill-appearing or diaphoretic.  HENT:     Nose: Nose normal. No congestion or rhinorrhea.     Mouth/Throat:      Mouth: Mucous membranes are moist.     Pharynx: No oropharyngeal exudate or posterior oropharyngeal erythema.  Eyes:     General: No scleral icterus.    Conjunctiva/sclera: Conjunctivae normal.  Neck:     Musculoskeletal: Normal range of motion. No neck rigidity or muscular tenderness.  Cardiovascular:     Rate and Rhythm: Normal rate and regular rhythm.     Heart sounds: No murmur.  Pulmonary:     Effort: Pulmonary effort is normal.     Breath sounds: No stridor. No wheezing, rhonchi or rales.  Abdominal:     General: Abdomen is flat. Bowel sounds are normal. There is no distension.     Palpations: There is no hepatomegaly or splenomegaly.     Tenderness: There is no abdominal tenderness.  Musculoskeletal: Normal range of motion.     Right lower leg: No edema.     Left lower leg: No edema.  Lymphadenopathy:     Cervical: No cervical adenopathy.  Skin:    General: Skin is warm.  Neurological:     General: No focal deficit present.     Mental Status: She is alert and oriented to person, place, and time.  Psychiatric:        Attention and Perception: Attention and perception normal.        Mood and Affect: Mood is anxious and depressed. Mood is not elated. Affect is angry and tearful. Affect is not inappropriate.        Speech: Speech normal.        Behavior: Behavior normal. Behavior is cooperative.        Thought Content: Thought content normal. Thought content is not paranoid or delusional. Thought content does not include homicidal or suicidal ideation.        Cognition and Memory: Cognition is not impaired. Memory is not impaired. She does not exhibit impaired recent memory or impaired remote memory.        Judgment: Judgment normal.      Lab Results  Component Value Date   WBC 6.2 04/12/2019   HGB 11.5 (L) 04/12/2019   HCT 36.0 04/12/2019   PLT 196.0 04/12/2019   GLUCOSE 90 04/12/2019   CHOL 191 04/12/2019   TRIG 83.0 04/12/2019   HDL 56.30 04/12/2019   LDLCALC  118 (H) 04/12/2019   ALT 12 04/12/2019   AST 13 04/12/2019   NA 142 04/12/2019   K 4.2 04/12/2019   CL 104 04/12/2019   CREATININE 0.78 04/12/2019   BUN 12 04/12/2019   CO2 28 04/12/2019   TSH 1.12 04/12/2019   HGBA1C 5.8 (H) 09/22/2013     Assessment & Plan:   Marcia Davis was seen today for annual exam.  Diagnoses and all orders for this visit:  Essential hypertension- Her BP is adequately well controlled.  Labs are negative for secondary causes or endorgan damage. -     CBC with Differential/Platelet; Future -     Comprehensive metabolic panel; Future -     TSH; Future  Routine general medical examination at a health  care facility - Exam completed.  Labs reviewed.  Vaccines reviewed.  Pap and mammogram are up-to-date.  Will screen for colon cancer/polyps with Cologuard.  Education material was given. -     Lipid panel; Future  Colon cancer screening -     Cologuard  Moderate episode of recurrent major depressive disorder ([place]) - I have referred her to a few different therapists.  Her lab work is negative for secondary causes.  I have asked her to start treating this with Viibryd. -     Vilazodone HCl (VIIBRYD STARTER PACK) 10 & 20 MG KIT; Take 1 tablet by mouth daily. -     CBC with Differential/Platelet; Future -     Comprehensive metabolic panel; Future -     TSH; Future  Deficiency anemia- She is mildly anemic.  I will monitor her for vitamin deficiencies. -     Vitamin B12; Future -     IBC panel; Future -     Folate; Future -     Ferritin; Future   I have discontinued Analycia Florea's Maca Root, Na Sulfate-K Sulfate-Mg Sulf, glycopyrrolate, and Olopatadine HCl. I am also having her start on [employer]. Additionally, I am having her maintain her albuterol, budesonide-formoterol, montelukast, and Carbinoxamine Maleate.  Meds ordered this encounter  Medications  . Vilazodone HCl (VIIBRYD STARTER PACK) 10 & 20 MG KIT    Sig: Take 1 tablet by mouth daily.     Dispense:  1 kit    Refill:  0     Follow-up: Return in about 3 months (around 07/13/2019).  Scarlette Calico, MD

## 2019-04-12 NOTE — Patient Instructions (Signed)
Major Depressive Disorder, Adult Major depressive disorder (MDD) is a mental health condition. It may also be called clinical depression or unipolar depression. MDD usually causes feelings of sadness, hopelessness, or helplessness. MDD can also cause physical symptoms. It can interfere with work, school, relationships, and other everyday activities. MDD may be mild, moderate, or severe. It may occur once (single episode major depressive disorder) or it may occur multiple times (recurrent major depressive disorder). What are the causes? The exact cause of this condition is not known. MDD is most likely caused by a combination of things, which may include:  Genetic factors. These are traits that are passed along from parent to child.  Individual factors. Your personality, your behavior, and the way you handle your thoughts and feelings may contribute to MDD. This includes personality traits and behaviors learned from others.  Physical factors, such as: ? Differences in the part of your brain that controls emotion. This part of your brain may be different than it is in people who do not have MDD. ? Long-term (chronic) medical or psychiatric illnesses.  Social factors. Traumatic experiences or major life changes may play a role in the development of MDD. What increases the risk? This condition is more likely to develop in women. The following factors may also make you more likely to develop MDD:  A family history of depression.  Troubled family relationships.  Abnormally low levels of certain brain chemicals.  Traumatic events in childhood, especially abuse or the loss of a parent.  Being under a lot of stress, or long-term stress, especially from upsetting life experiences or losses.  A history of: ? Chronic physical illness. ? Other mental health disorders. ? Substance abuse.  Poor living conditions.  Experiencing social exclusion or discrimination on a regular basis. What are the  signs or symptoms? The main symptoms of MDD typically include:  Constant depressed or irritable mood.  Loss of interest in things and activities. MDD symptoms may also include:  Sleeping or eating too much or too little.  Unexplained weight change.  Fatigue or low energy.  Feelings of worthlessness or guilt.  Difficulty thinking clearly or making decisions.  Thoughts of suicide or of harming others.  Physical agitation or weakness.  Isolation. Severe cases of MDD may also occur with other symptoms, such as:  Delusions or hallucinations, in which you imagine things that are not real (psychotic depression).  Low-level depression that lasts at least a year (chronic depression or persistent depressive disorder).  Extreme sadness and hopelessness (melancholic depression).  Trouble speaking and moving (catatonic depression). How is this diagnosed? This condition may be diagnosed based on:  Your symptoms.  Your medical history, including your mental health history. This may involve tests to evaluate your mental health. You may be asked questions about your lifestyle, including any drug and alcohol use, and how long you have had symptoms of MDD.  A physical exam.  Blood tests to rule out other conditions. You must have a depressed mood and at least four other MDD symptoms most of the day, nearly every day in the same 2-week timeframe before your health care provider can confirm a diagnosis of MDD. How is this treated? This condition is usually treated by mental health professionals, such as psychologists, psychiatrists, and clinical social workers. You may need more than one type of treatment. Treatment may include:  Psychotherapy. This is also called talk therapy or counseling. Types of psychotherapy include: ? Cognitive behavioral therapy (CBT). This type of therapy   teaches you to recognize unhealthy feelings, thoughts, and behaviors, and replace them with positive thoughts  and actions. ? Interpersonal therapy (IPT). This helps you to improve the way you relate to and communicate with others. ? Family therapy. This treatment includes members of your family.  Medicine to treat anxiety and depression, or to help you control certain emotions and behaviors.  Lifestyle changes, such as: ? Limiting alcohol and drug use. ? Exercising regularly. ? Getting plenty of sleep. ? Making healthy eating choices. ? Spending more time outdoors.  Treatments involving stimulation of the brain can be used in situations with extremely severe symptoms, or when medicine or other therapies do not work over time. These treatments include electroconvulsive therapy, transcranial magnetic stimulation, and vagal nerve stimulation. Follow these instructions at home: Activity  Return to your normal activities as told by your health care provider.  Exercise regularly and spend time outdoors as told by your health care provider. General instructions  Take over-the-counter and prescription medicines only as told by your health care provider.  Do not drink alcohol. If you drink alcohol, limit your alcohol intake to no more than 1 drink a day for nonpregnant women and 2 drinks a day for men. One drink equals 12 oz of beer, 5 oz of wine, or 1 oz of hard liquor. Alcohol can affect any antidepressant medicines you are taking. Talk to your health care provider about your alcohol use.  Eat a healthy diet and get plenty of sleep.  Find activities that you enjoy doing, and make time to do them.  Consider joining a support group. Your health care provider may be able to recommend a support group.  Keep all follow-up visits as told by your health care provider. This is important. Where to find more information National Alliance on Mental Illness  www.nami.org [place] National Institute of Mental Health  www.nimh.nih.gov National Suicide Prevention Lifeline  1-800-273-TALK (8255). This is  free, 24-hour help. Contact a health care provider if:  Your symptoms get worse.  You develop new symptoms. Get help right away if:  You self-harm.  You have serious thoughts about hurting yourself or others.  You see, hear, taste, smell, or feel things that are not present (hallucinate). This information is not intended to replace advice given to you by your health care provider. Make sure you discuss any questions you have with your health care provider. Document Released: 01/25/2013 Document Revised: 09/12/2017 Document Reviewed: 04/10/2016 Elsevier Patient Education  2020 Elsevier Inc.  

## 2019-04-13 ENCOUNTER — Encounter: Payer: Self-pay | Admitting: Internal Medicine

## 2019-04-13 DIAGNOSIS — D539 Nutritional anemia, unspecified: Secondary | ICD-10-CM | POA: Insufficient documentation

## 2019-04-13 LAB — COMPREHENSIVE METABOLIC PANEL
ALT: 12 U/L (ref 0–35)
AST: 13 U/L (ref 0–37)
Albumin: 4.5 g/dL (ref 3.5–5.2)
Alkaline Phosphatase: 53 U/L (ref 39–117)
BUN: 12 mg/dL (ref 6–23)
CO2: 28 mEq/L (ref 19–32)
Calcium: 9.5 mg/dL (ref 8.4–10.5)
Chloride: 104 mEq/L (ref 96–112)
Creatinine, Ser: 0.78 mg/dL (ref 0.40–1.20)
GFR: 77.76 mL/min (ref >60.00)
Glucose, Bld: 90 mg/dL (ref 70–99)
Potassium: 4.2 mEq/L (ref 3.5–5.1)
Sodium: 142 mEq/L (ref 135–145)
Total Bilirubin: 0.2 mg/dL (ref 0.2–1.2)
Total Protein: 7.4 g/dL (ref 6.0–8.3)

## 2019-04-13 LAB — LIPID PANEL
Cholesterol: 191 mg/dL (ref 0–200)
HDL: 56.3 mg/dL
LDL Cholesterol: 118 mg/dL — ABNORMAL HIGH (ref 0–99)
NonHDL: 134.22
Total CHOL/HDL Ratio: 3
Triglycerides: 83 mg/dL (ref 0.0–149.0)
VLDL: 16.6 mg/dL (ref 0.0–40.0)

## 2019-05-24 DIAGNOSIS — M9903 Segmental and somatic dysfunction of lumbar region: Secondary | ICD-10-CM | POA: Diagnosis not present

## 2019-05-24 DIAGNOSIS — M9901 Segmental and somatic dysfunction of cervical region: Secondary | ICD-10-CM | POA: Diagnosis not present

## 2019-05-24 DIAGNOSIS — M5136 Other intervertebral disc degeneration, lumbar region: Secondary | ICD-10-CM | POA: Diagnosis not present

## 2019-05-24 DIAGNOSIS — M5137 Other intervertebral disc degeneration, lumbosacral region: Secondary | ICD-10-CM | POA: Diagnosis not present

## 2019-09-30 ENCOUNTER — Other Ambulatory Visit: Payer: Self-pay

## 2019-09-30 ENCOUNTER — Other Ambulatory Visit: Payer: Self-pay | Admitting: Allergy

## 2019-09-30 MED ORDER — BUDESONIDE-FORMOTEROL FUMARATE 80-4.5 MCG/ACT IN AERO: 2.0000 | INHALATION_SPRAY | Freq: Two times a day (BID) | RESPIRATORY_TRACT | 0 refills | Status: DC

## 2019-10-18 DIAGNOSIS — M5136 Other intervertebral disc degeneration, lumbar region: Secondary | ICD-10-CM | POA: Diagnosis not present

## 2019-10-18 DIAGNOSIS — M5137 Other intervertebral disc degeneration, lumbosacral region: Secondary | ICD-10-CM | POA: Diagnosis not present

## 2019-10-18 DIAGNOSIS — M9903 Segmental and somatic dysfunction of lumbar region: Secondary | ICD-10-CM | POA: Diagnosis not present

## 2019-10-18 DIAGNOSIS — M9901 Segmental and somatic dysfunction of cervical region: Secondary | ICD-10-CM | POA: Diagnosis not present

## 2019-10-20 DIAGNOSIS — M5136 Other intervertebral disc degeneration, lumbar region: Secondary | ICD-10-CM | POA: Diagnosis not present

## 2019-10-20 DIAGNOSIS — M9901 Segmental and somatic dysfunction of cervical region: Secondary | ICD-10-CM | POA: Diagnosis not present

## 2019-10-20 DIAGNOSIS — M9903 Segmental and somatic dysfunction of lumbar region: Secondary | ICD-10-CM | POA: Diagnosis not present

## 2019-10-20 DIAGNOSIS — M5137 Other intervertebral disc degeneration, lumbosacral region: Secondary | ICD-10-CM | POA: Diagnosis not present

## 2019-10-25 DIAGNOSIS — M5137 Other intervertebral disc degeneration, lumbosacral region: Secondary | ICD-10-CM | POA: Diagnosis not present

## 2019-10-25 DIAGNOSIS — M9901 Segmental and somatic dysfunction of cervical region: Secondary | ICD-10-CM | POA: Diagnosis not present

## 2019-10-25 DIAGNOSIS — M5136 Other intervertebral disc degeneration, lumbar region: Secondary | ICD-10-CM | POA: Diagnosis not present

## 2019-10-25 DIAGNOSIS — M9903 Segmental and somatic dysfunction of lumbar region: Secondary | ICD-10-CM | POA: Diagnosis not present

## 2019-10-27 DIAGNOSIS — M5137 Other intervertebral disc degeneration, lumbosacral region: Secondary | ICD-10-CM | POA: Diagnosis not present

## 2019-10-27 DIAGNOSIS — M9903 Segmental and somatic dysfunction of lumbar region: Secondary | ICD-10-CM | POA: Diagnosis not present

## 2019-10-27 DIAGNOSIS — M9901 Segmental and somatic dysfunction of cervical region: Secondary | ICD-10-CM | POA: Diagnosis not present

## 2019-10-27 DIAGNOSIS — M5136 Other intervertebral disc degeneration, lumbar region: Secondary | ICD-10-CM | POA: Diagnosis not present

## 2020-01-06 DIAGNOSIS — M5136 Other intervertebral disc degeneration, lumbar region: Secondary | ICD-10-CM | POA: Diagnosis not present

## 2020-01-06 DIAGNOSIS — M9903 Segmental and somatic dysfunction of lumbar region: Secondary | ICD-10-CM | POA: Diagnosis not present

## 2020-01-06 DIAGNOSIS — M5137 Other intervertebral disc degeneration, lumbosacral region: Secondary | ICD-10-CM | POA: Diagnosis not present

## 2020-01-06 DIAGNOSIS — M9901 Segmental and somatic dysfunction of cervical region: Secondary | ICD-10-CM | POA: Diagnosis not present

## 2020-01-10 DIAGNOSIS — M5137 Other intervertebral disc degeneration, lumbosacral region: Secondary | ICD-10-CM | POA: Diagnosis not present

## 2020-01-10 DIAGNOSIS — M9903 Segmental and somatic dysfunction of lumbar region: Secondary | ICD-10-CM | POA: Diagnosis not present

## 2020-01-10 DIAGNOSIS — M9901 Segmental and somatic dysfunction of cervical region: Secondary | ICD-10-CM | POA: Diagnosis not present

## 2020-01-10 DIAGNOSIS — M5136 Other intervertebral disc degeneration, lumbar region: Secondary | ICD-10-CM | POA: Diagnosis not present

## 2020-01-13 DIAGNOSIS — M9901 Segmental and somatic dysfunction of cervical region: Secondary | ICD-10-CM | POA: Diagnosis not present

## 2020-01-13 DIAGNOSIS — M5137 Other intervertebral disc degeneration, lumbosacral region: Secondary | ICD-10-CM | POA: Diagnosis not present

## 2020-01-13 DIAGNOSIS — M9903 Segmental and somatic dysfunction of lumbar region: Secondary | ICD-10-CM | POA: Diagnosis not present

## 2020-01-13 DIAGNOSIS — M5136 Other intervertebral disc degeneration, lumbar region: Secondary | ICD-10-CM | POA: Diagnosis not present

## 2020-01-19 DIAGNOSIS — M9901 Segmental and somatic dysfunction of cervical region: Secondary | ICD-10-CM | POA: Diagnosis not present

## 2020-01-19 DIAGNOSIS — M9903 Segmental and somatic dysfunction of lumbar region: Secondary | ICD-10-CM | POA: Diagnosis not present

## 2020-01-19 DIAGNOSIS — M5137 Other intervertebral disc degeneration, lumbosacral region: Secondary | ICD-10-CM | POA: Diagnosis not present

## 2020-01-19 DIAGNOSIS — M5136 Other intervertebral disc degeneration, lumbar region: Secondary | ICD-10-CM | POA: Diagnosis not present

## 2020-01-22 ENCOUNTER — Other Ambulatory Visit: Payer: Self-pay | Admitting: Allergy

## 2020-01-26 DIAGNOSIS — M5137 Other intervertebral disc degeneration, lumbosacral region: Secondary | ICD-10-CM | POA: Diagnosis not present

## 2020-01-26 DIAGNOSIS — M9901 Segmental and somatic dysfunction of cervical region: Secondary | ICD-10-CM | POA: Diagnosis not present

## 2020-01-26 DIAGNOSIS — M5136 Other intervertebral disc degeneration, lumbar region: Secondary | ICD-10-CM | POA: Diagnosis not present

## 2020-01-26 DIAGNOSIS — M9903 Segmental and somatic dysfunction of lumbar region: Secondary | ICD-10-CM | POA: Diagnosis not present

## 2020-02-09 DIAGNOSIS — M9903 Segmental and somatic dysfunction of lumbar region: Secondary | ICD-10-CM | POA: Diagnosis not present

## 2020-02-09 DIAGNOSIS — M5137 Other intervertebral disc degeneration, lumbosacral region: Secondary | ICD-10-CM | POA: Diagnosis not present

## 2020-02-09 DIAGNOSIS — M9901 Segmental and somatic dysfunction of cervical region: Secondary | ICD-10-CM | POA: Diagnosis not present

## 2020-02-09 DIAGNOSIS — M5136 Other intervertebral disc degeneration, lumbar region: Secondary | ICD-10-CM | POA: Diagnosis not present

## 2020-02-23 DIAGNOSIS — M5136 Other intervertebral disc degeneration, lumbar region: Secondary | ICD-10-CM | POA: Diagnosis not present

## 2020-02-23 DIAGNOSIS — M9903 Segmental and somatic dysfunction of lumbar region: Secondary | ICD-10-CM | POA: Diagnosis not present

## 2020-02-23 DIAGNOSIS — M9901 Segmental and somatic dysfunction of cervical region: Secondary | ICD-10-CM | POA: Diagnosis not present

## 2020-02-23 DIAGNOSIS — M5137 Other intervertebral disc degeneration, lumbosacral region: Secondary | ICD-10-CM | POA: Diagnosis not present

## 2020-03-08 DIAGNOSIS — M9903 Segmental and somatic dysfunction of lumbar region: Secondary | ICD-10-CM | POA: Diagnosis not present

## 2020-03-08 DIAGNOSIS — M5137 Other intervertebral disc degeneration, lumbosacral region: Secondary | ICD-10-CM | POA: Diagnosis not present

## 2020-03-08 DIAGNOSIS — M9901 Segmental and somatic dysfunction of cervical region: Secondary | ICD-10-CM | POA: Diagnosis not present

## 2020-03-08 DIAGNOSIS — M5136 Other intervertebral disc degeneration, lumbar region: Secondary | ICD-10-CM | POA: Diagnosis not present

## 2020-03-29 DIAGNOSIS — M9903 Segmental and somatic dysfunction of lumbar region: Secondary | ICD-10-CM | POA: Diagnosis not present

## 2020-03-29 DIAGNOSIS — M5137 Other intervertebral disc degeneration, lumbosacral region: Secondary | ICD-10-CM | POA: Diagnosis not present

## 2020-03-29 DIAGNOSIS — M9901 Segmental and somatic dysfunction of cervical region: Secondary | ICD-10-CM | POA: Diagnosis not present

## 2020-03-29 DIAGNOSIS — M5136 Other intervertebral disc degeneration, lumbar region: Secondary | ICD-10-CM | POA: Diagnosis not present

## 2020-04-26 DIAGNOSIS — M5136 Other intervertebral disc degeneration, lumbar region: Secondary | ICD-10-CM | POA: Diagnosis not present

## 2020-04-26 DIAGNOSIS — M9901 Segmental and somatic dysfunction of cervical region: Secondary | ICD-10-CM | POA: Diagnosis not present

## 2020-04-26 DIAGNOSIS — M5137 Other intervertebral disc degeneration, lumbosacral region: Secondary | ICD-10-CM | POA: Diagnosis not present

## 2020-04-26 DIAGNOSIS — M9903 Segmental and somatic dysfunction of lumbar region: Secondary | ICD-10-CM | POA: Diagnosis not present

## 2020-06-04 IMAGING — US US ABDOMEN COMPLETE
1 series · 14 of 25 positions shown · non-contrast
Comparison: None.

CLINICAL DATA: Nausea, epigastric pain

EXAM:
ABDOMEN ULTRASOUND COMPLETE

[Series 1: us abdomen complete · 14 of 109 slices shown]
[im 1/109]
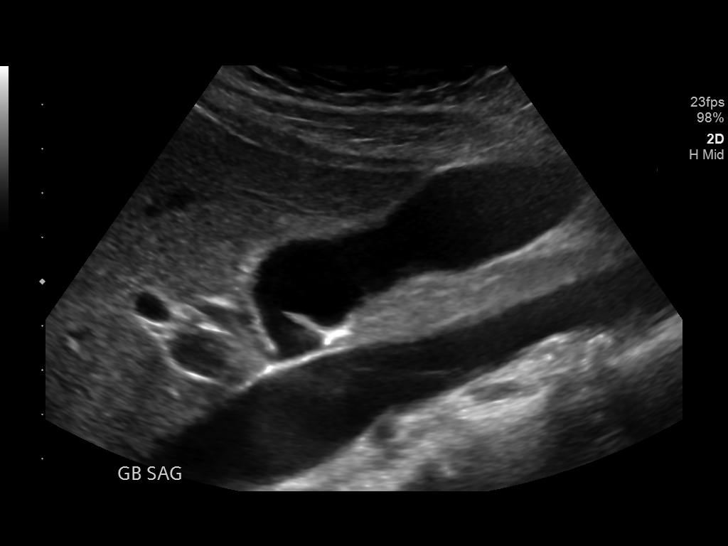
[im 10/109]
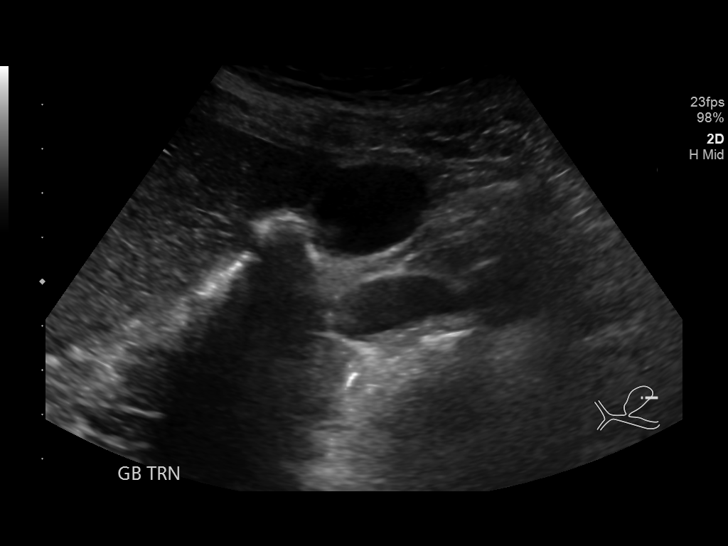
[im 19/109]
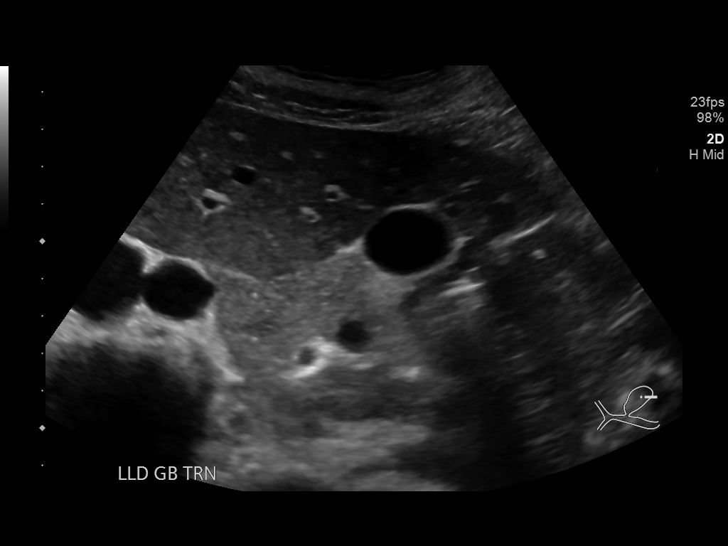
[im 28/109]
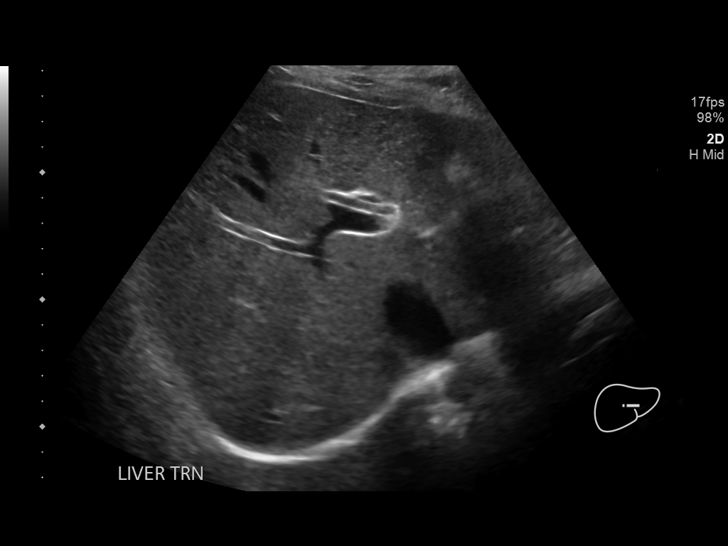
[im 37/109]
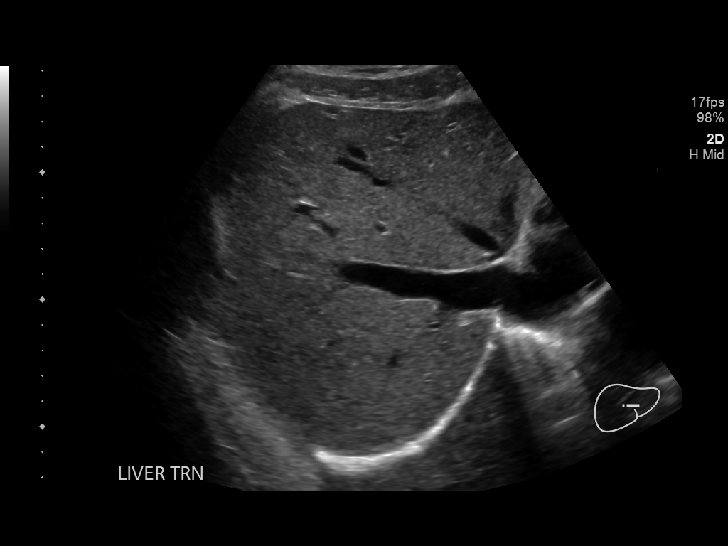
[im 41/109]
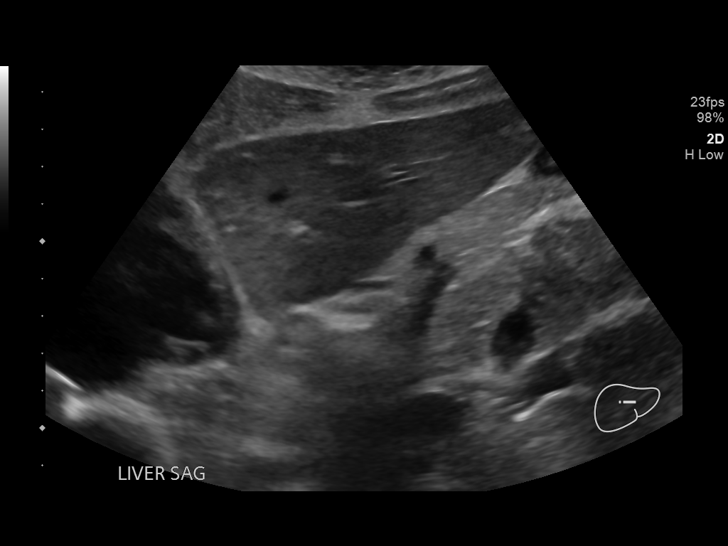
[im 50/109]
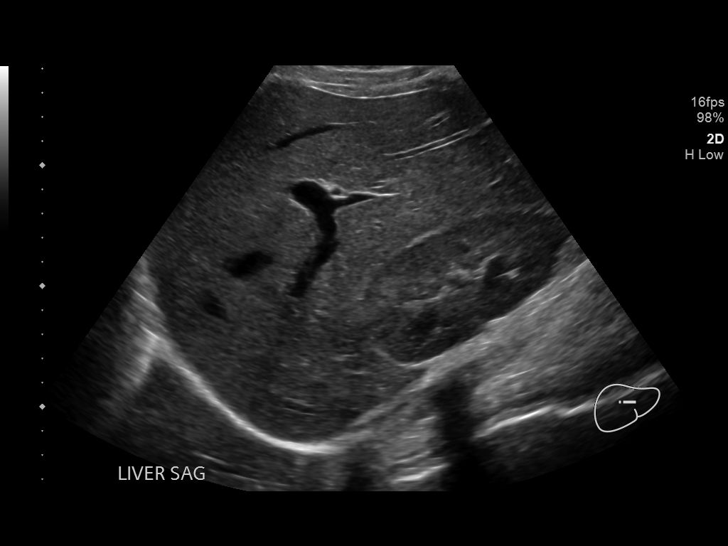
[im 59/109]
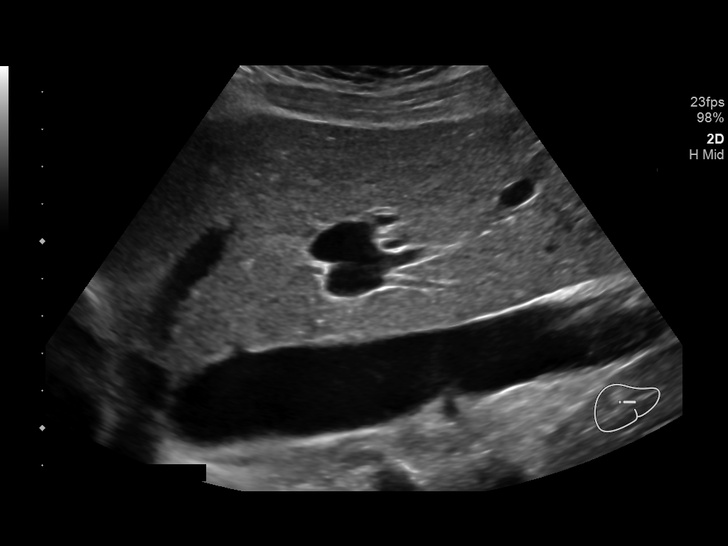
[im 68/109]
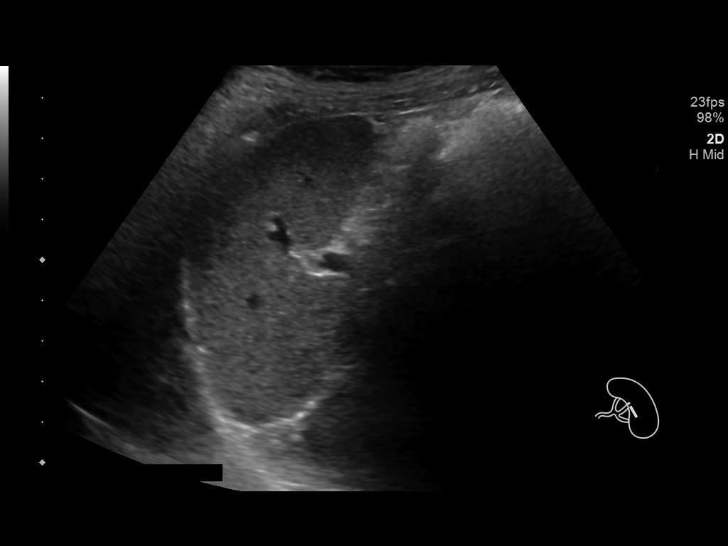
[im 73/109]
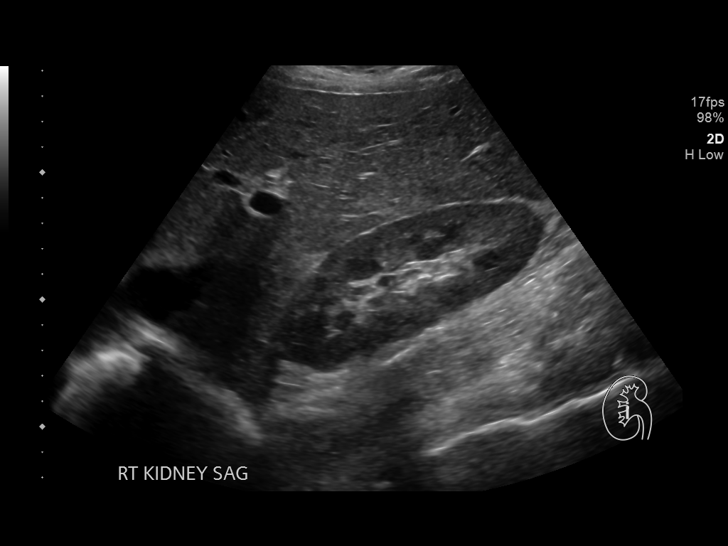
[im 82/109]
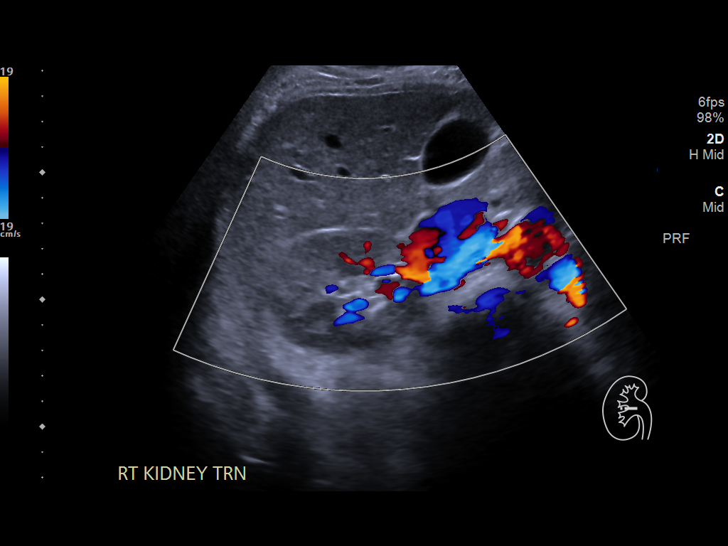
[im 91/109]
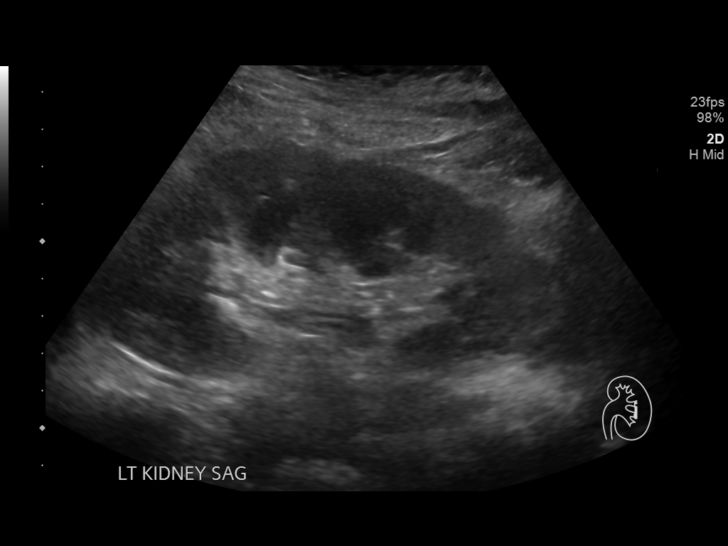
[im 100/109]
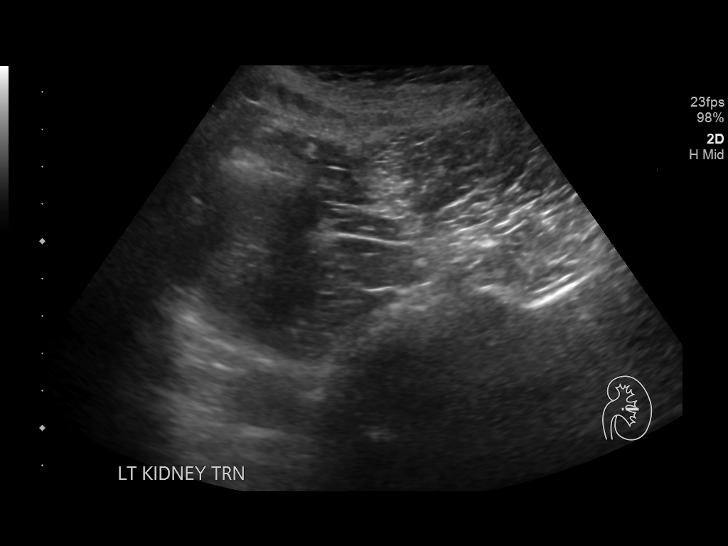
[im 109/109]
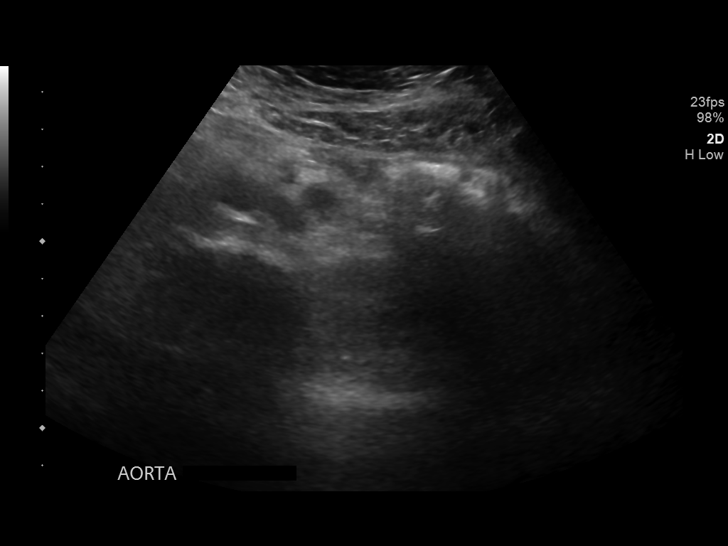

[14 of 25 positions shown; findings below may reference images not displayed]

FINDINGS: Gallbladder: No gallstones or wall thickening visualized. No
sonographic Murphy sign noted by sonographer.

Common bile duct: Diameter: Normal caliber, 5 mm

Liver: No focal lesion identified. Within normal limits in
parenchymal echogenicity. Portal vein is patent on color Doppler
imaging with normal direction of blood flow towards the liver.

IVC: No abnormality visualized.

Pancreas: Visualized portion unremarkable.

Spleen: Size and appearance within normal limits.

Right Kidney: Length: 11.7 cm. Echogenicity within normal limits. No
mass or hydronephrosis visualized.

Left Kidney: Length: 12.4 cm. Echogenicity within normal limits. No
mass or hydronephrosis visualized.

Abdominal aorta: No aneurysm visualized.

Other findings: None.
IMPRESSION: Unremarkable abdominal ultrasound.

## 2020-06-15 DIAGNOSIS — M5137 Other intervertebral disc degeneration, lumbosacral region: Secondary | ICD-10-CM | POA: Diagnosis not present

## 2020-06-15 DIAGNOSIS — M5136 Other intervertebral disc degeneration, lumbar region: Secondary | ICD-10-CM | POA: Diagnosis not present

## 2020-06-15 DIAGNOSIS — M9901 Segmental and somatic dysfunction of cervical region: Secondary | ICD-10-CM | POA: Diagnosis not present

## 2020-06-15 DIAGNOSIS — M9903 Segmental and somatic dysfunction of lumbar region: Secondary | ICD-10-CM | POA: Diagnosis not present

## 2020-07-24 DIAGNOSIS — M5137 Other intervertebral disc degeneration, lumbosacral region: Secondary | ICD-10-CM | POA: Diagnosis not present

## 2020-07-24 DIAGNOSIS — M9901 Segmental and somatic dysfunction of cervical region: Secondary | ICD-10-CM | POA: Diagnosis not present

## 2020-07-24 DIAGNOSIS — M9903 Segmental and somatic dysfunction of lumbar region: Secondary | ICD-10-CM | POA: Diagnosis not present

## 2020-07-24 DIAGNOSIS — M5136 Other intervertebral disc degeneration, lumbar region: Secondary | ICD-10-CM | POA: Diagnosis not present

## 2020-08-17 DIAGNOSIS — M5136 Other intervertebral disc degeneration, lumbar region: Secondary | ICD-10-CM | POA: Diagnosis not present

## 2020-08-17 DIAGNOSIS — M9903 Segmental and somatic dysfunction of lumbar region: Secondary | ICD-10-CM | POA: Diagnosis not present

## 2020-08-17 DIAGNOSIS — M9901 Segmental and somatic dysfunction of cervical region: Secondary | ICD-10-CM | POA: Diagnosis not present

## 2020-08-17 DIAGNOSIS — M5137 Other intervertebral disc degeneration, lumbosacral region: Secondary | ICD-10-CM | POA: Diagnosis not present

## 2020-08-31 DIAGNOSIS — M5137 Other intervertebral disc degeneration, lumbosacral region: Secondary | ICD-10-CM | POA: Diagnosis not present

## 2020-08-31 DIAGNOSIS — M9903 Segmental and somatic dysfunction of lumbar region: Secondary | ICD-10-CM | POA: Diagnosis not present

## 2020-08-31 DIAGNOSIS — M9901 Segmental and somatic dysfunction of cervical region: Secondary | ICD-10-CM | POA: Diagnosis not present

## 2020-08-31 DIAGNOSIS — M5136 Other intervertebral disc degeneration, lumbar region: Secondary | ICD-10-CM | POA: Diagnosis not present

## 2020-09-14 DIAGNOSIS — M5136 Other intervertebral disc degeneration, lumbar region: Secondary | ICD-10-CM | POA: Diagnosis not present

## 2020-09-14 DIAGNOSIS — M5137 Other intervertebral disc degeneration, lumbosacral region: Secondary | ICD-10-CM | POA: Diagnosis not present

## 2020-09-14 DIAGNOSIS — M9903 Segmental and somatic dysfunction of lumbar region: Secondary | ICD-10-CM | POA: Diagnosis not present

## 2020-09-14 DIAGNOSIS — M9901 Segmental and somatic dysfunction of cervical region: Secondary | ICD-10-CM | POA: Diagnosis not present

## 2020-09-18 ENCOUNTER — Ambulatory Visit (INDEPENDENT_AMBULATORY_CARE_PROVIDER_SITE_OTHER): Payer: BC Managed Care – PPO | Admitting: Allergy

## 2020-09-18 ENCOUNTER — Other Ambulatory Visit: Payer: Self-pay

## 2020-09-18 ENCOUNTER — Encounter: Payer: Self-pay | Admitting: Allergy

## 2020-09-18 VITALS — BP 132/84 | HR 66 | Temp 97.0°F | Resp 14 | Ht 61.0 in | Wt 191.4 lb

## 2020-09-18 DIAGNOSIS — J3089 Other allergic rhinitis: Secondary | ICD-10-CM | POA: Diagnosis not present

## 2020-09-18 DIAGNOSIS — J302 Other seasonal allergic rhinitis: Secondary | ICD-10-CM

## 2020-09-18 DIAGNOSIS — H1010 Acute atopic conjunctivitis, unspecified eye: Secondary | ICD-10-CM | POA: Diagnosis not present

## 2020-09-18 DIAGNOSIS — J4530 Mild persistent asthma, uncomplicated: Secondary | ICD-10-CM

## 2020-09-18 MED ORDER — MONTELUKAST SODIUM 10 MG PO TABS: 10.0000 mg | ORAL_TABLET | Freq: Every day | ORAL | 5 refills | Status: DC

## 2020-09-18 MED ORDER — FLUTICASONE PROPIONATE 50 MCG/ACT NA SUSP: 1.0000 | Freq: Two times a day (BID) | NASAL | 5 refills | Status: DC | PRN

## 2020-09-18 MED ORDER — ALBUTEROL SULFATE HFA 108 (90 BASE) MCG/ACT IN AERS: 2.0000 | INHALATION_SPRAY | RESPIRATORY_TRACT | 1 refills | Status: DC | PRN

## 2020-09-18 MED ORDER — FLUTICASONE FUROATE-VILANTEROL 100-25 MCG/INH IN AEPB: 1.0000 | INHALATION_SPRAY | Freq: Every day | RESPIRATORY_TRACT | 5 refills | Status: DC

## 2020-09-18 NOTE — Assessment & Plan Note (Addendum)
Was able to wean down to once daily with Symbicort but now noticing some chest tightness after 12 hours. Gained weight, not very active since last visit.  Today's spirometry was normal.  ACT score 19.  Daily controller medication(s):  ? Start Breo 1 puff daily and rinse mouth after each use. Sample given. Hopefully once a day dosing will improve compliance and symptom control. ? This replaces Symbicort.   May use albuterol rescue inhaler 2 puffs every 4 to 6 hours as needed for shortness of breath, chest tightness, coughing, and wheezing. May use albuterol rescue inhaler 2 puffs 5 to 15 minutes prior to strenuous physical activities. Monitor frequency of use.   Repeat spirometry at next visit.  Encouraged exercise and weight loss.

## 2020-09-18 NOTE — Patient Instructions (Addendum)
Mild persistent asthma  Daily controller medication(s):  ? Start Breo 1 puff daily and rinse mouth after each use. Sample given. ? This replaces Symbicort.   May use albuterol rescue inhaler 2 puffs every 4 to 6 hours as needed for shortness of breath, chest tightness, coughing, and wheezing. May use albuterol rescue inhaler 2 puffs 5 to 15 minutes prior to strenuous physical activities. Monitor frequency of use.  Asthma control goals:  Full participation in all desired activities (may need albuterol before activity) Albuterol use two times or less a week on average (not counting use with activity) Cough interfering with sleep two times or less a month Oral steroids no more than once a year No hospitalizations  Seasonal and perennial allergic rhinoconjunctivitis 2018 skin testing was positive to grass, weed, tree, dust mites.  May use Singulair 10 mg daily at night.  May use over-the-counter antihistamine such as Zyrtec 10 mg daily in the morning.  May use nasal saline spray (i.e., Simply Saline) or nasal saline lavage (i.e., NeilMed) is recommended as needed and prior to medicated nasal sprays.  May use Flonase (fluticasone) nasal spray 1 spray per nostril twice a day as needed for nasal congestion.   May use Pazeo 1 drop in each eye as needed daily for itchy eyes.  Continue environmental control measures.  Nose Bleeds: . Nosebleeds are very common.  Site of the bleeding is typically on the septum or at the very front of the nose.  Some of the more common causes are from trauma, inflammation or medication induced. Marland Kitchen Pinch both nostrils while leaning forward for at least 5 minutes before checking to see if the bleeding has stopped. If bleeding is not controlled within 5-10 minutes apply a cotton ball soaked with oxymetazoline (Afrin) to the bleeding nostril for a few seconds.  Preventative treatment: . Apply saline nasal gel in each nostril twice a day for 2 weeks to allow  the nasal mucosa to heal . Consider using a humidifier in the winter . Try to keep your blood pressure as normal as possible (120/80)  Follow up in 6 months or sooner if needed.

## 2020-09-18 NOTE — Progress Notes (Signed)
Follow Up Note  RE: Marcia Davis MRN: [id number] DOB: [birth year] Date of Office Visit: 09/18/2020  Referring provider: Janith Lima, MD Primary care provider: Patient, No Pcp Per  Chief Complaint: Asthma (Using her inhaler more than she use to. )  History of Present Illness: I had the pleasure of seeing Marcia Davis for a follow up visit at the [facility] [place] [facility] on 09/18/2020. She is a 54 y.o. female, who is being followed for asthma, allergic rhinoconjunctivitis and atopic dermatitis. Her previous allergy office visit was on 01/13/2019 with Dr. Maudie Mercury via telemedicine. Today is a regular follow up visit.  Mild persistent asthma Patient was taking Symbicort once a day and notices now that after 12 hours she gets chest tightness if she doesn't take her second dose.  She gained 20lbs, went through menopause and not exercising as much since her last OV.   Currently on Symbicort 52mg 2 puffs twice a day and not using spacer. Does not rinse mouth after each use. Advair Diskus worked the best for her but insurance stopped covering it.   Denies any ER/urgent care visits or prednisone use since the last visit.  Seasonal and perennial allergic rhinoconjunctivitis Not having issues but has symptoms when around pets. Only takes Singulair as needed. Does not take any OTC antihistamines.   Snoring at night and hesitant about taking steroid nasal sprays.   Assessment and Plan: Marcia Davis a 54y.o. female with: Mild persistent asthma, uncomplicated Was able to wean down to once daily with Symbicort but now noticing some chest tightness after 12 hours. Gained weight, not very active since last visit.  Today's spirometry was normal.  ACT score 19.  Daily controller medication(s):  ? Start Breo 1066m 1 puff daily and rinse mouth after each use. Sample given. Hopefully once a day dosing will improve compliance and symptom control. ? This replaces Symbicort.   May use albuterol  rescue inhaler 2 puffs every 4 to 6 hours as needed for shortness of breath, chest tightness, coughing, and wheezing. May use albuterol rescue inhaler 2 puffs 5 to 15 minutes prior to strenuous physical activities. Monitor frequency of use.   Repeat spirometry at next visit.  Encouraged exercise and weight loss.   Seasonal and perennial allergic rhinoconjunctivitis Past history - 2018 skin testing was positive to grass, weed, tree, dust mites. Interim history - increased symptoms around pets.  May use Singulair 10 mg daily at night.  May use over-the-counter antihistamine such as Zyrtec 10 mg daily in the morning.  May use nasal saline spray (i.e., Simply Saline) or nasal saline lavage (i.e., NeilMed) is recommended as needed and prior to medicated nasal sprays.  May use Flonase (fluticasone) nasal spray 1 spray per nostril twice a day as needed for nasal congestion.   Patient willing to try.   May use Pazeo 1 drop in each eye as needed daily for itchy eyes.  Continue environmental control measures.  Return in about 6 months (around 03/19/2021).  Meds ordered this encounter  Medications  . montelukast (SINGULAIR) 10 MG tablet    Sig: Take 1 tablet (10 mg total) by mouth at bedtime.    Dispense:  30 tablet    Refill:  5  . albuterol (PROAIR HFA) 108 (90 Base) MCG/ACT inhaler    Sig: Inhale 2 puffs into the lungs every 4 (four) hours as needed for wheezing or shortness of breath.    Dispense:  8 g    Refill:  1  . fluticasone furoate-vilanterol (BREO ELLIPTA) 100-25 MCG/INH AEPB    Sig: Inhale 1 puff into the lungs daily. Rinse mouth after each use    Dispense:  60 each    Refill:  5  . fluticasone (FLONASE) 50 MCG/ACT nasal spray    Sig: Place 1 spray into both nostrils 2 (two) times daily as needed (nasal congestion).    Dispense:  16 g    Refill:  5   Diagnostics: Spirometry:  Tracings reviewed. Her effort: Good reproducible efforts. FVC: 2.88L FEV1: 2.41L, 97%  predicted FEV1/FVC ratio: 84% Interpretation: Spirometry consistent with normal pattern.  Please see scanned spirometry results for details.  Medication List:  Current Outpatient Medications  Medication Sig Dispense Refill  . albuterol (PROAIR HFA) 108 (90 Base) MCG/ACT inhaler Inhale 2 puffs into the lungs every 4 (four) hours as needed for wheezing or shortness of breath. 8 g 1  . montelukast (SINGULAIR) 10 MG tablet Take 1 tablet (10 mg total) by mouth at bedtime. 30 tablet 5  . Carbinoxamine Maleate (RYVENT) 6 MG TABS Take 1 tablet by mouth at bedtime as needed. (Patient not taking: Reported on 09/18/2020) 90 tablet 1  . fluticasone (FLONASE) 50 MCG/ACT nasal spray Place 1 spray into both nostrils 2 (two) times daily as needed (nasal congestion). 16 g 5  . fluticasone furoate-vilanterol (BREO ELLIPTA) 100-25 MCG/INH AEPB Inhale 1 puff into the lungs daily. Rinse mouth after each use 60 each 5  . Vilazodone HCl (VIIBRYD STARTER PACK) 10 & 20 MG KIT Take 1 tablet by mouth daily. (Patient not taking: Reported on 09/18/2020) 1 kit 0   No current facility-administered medications for this visit.   Allergies: Allergies  Allergen Reactions  . Codeine     REACTION: nausea   I reviewed her past medical history, social history, family history, and environmental history and no significant changes have been reported from her previous visit.  Review of Systems  Constitutional: Negative for appetite change, chills, fever and unexpected weight change.  HENT: Positive for congestion. Negative for rhinorrhea.   Eyes: Negative for itching.  Respiratory: Negative for cough, chest tightness, shortness of breath and wheezing.   Gastrointestinal: Negative for abdominal pain.  Skin: Negative for rash.  Neurological: Negative for headaches.   Objective: BP 132/84   Pulse 66   Temp (!) 97 F (36.1 C)   Resp 14   Ht 5' 1"  (1.549 m)   Wt 191 lb 6.4 oz (86.8 kg)   SpO2 99%   BMI 36.16 kg/m  Body  mass index is 36.16 kg/m. Physical Exam Vitals and nursing note reviewed.  Constitutional:      Appearance: Normal appearance. She is well-developed.  HENT:     Head: Normocephalic and atraumatic.     Right Ear: External ear normal.     Left Ear: External ear normal.     Nose: Nose normal.     Mouth/Throat:     Mouth: Mucous membranes are moist.     Pharynx: Oropharynx is clear.  Eyes:     Conjunctiva/sclera: Conjunctivae normal.  Cardiovascular:     Rate and Rhythm: Normal rate and regular rhythm.     Heart sounds: Normal heart sounds. No murmur heard.   Pulmonary:     Effort: Pulmonary effort is normal.     Breath sounds: Normal breath sounds. No wheezing, rhonchi or rales.  Musculoskeletal:     Cervical back: Neck supple.  Skin:    General: Skin is warm.  Findings: No rash.  Neurological:     Mental Status: She is alert and oriented to person, place, and time.  Psychiatric:        Behavior: Behavior normal.    Previous notes and tests were reviewed. The plan was reviewed with the patient/family, and all questions/concerned were addressed.  It was my pleasure to see Marcia Davis today and participate in her care. Please feel free to contact me with any questions or concerns.  Sincerely,  Rexene Alberts, DO Allergy & Immunology  [facility] office: [place] office: [phone]

## 2020-09-18 NOTE — Assessment & Plan Note (Signed)
Past history - 2018 skin testing was positive to grass, weed, tree, dust mites. Interim history - increased symptoms around pets.  May use Singulair 10 mg daily at night.  May use over-the-counter antihistamine such as Zyrtec 10 mg daily in the morning.  May use nasal saline spray (i.e., Simply Saline) or nasal saline lavage (i.e., NeilMed) is recommended as needed and prior to medicated nasal sprays.  May use Flonase (fluticasone) nasal spray 1 spray per nostril twice a day as needed for nasal congestion.   Patient willing to try.   May use Pazeo 1 drop in each eye as needed daily for itchy eyes.  Continue environmental control measures.

## 2020-10-04 DIAGNOSIS — M5137 Other intervertebral disc degeneration, lumbosacral region: Secondary | ICD-10-CM | POA: Diagnosis not present

## 2020-10-04 DIAGNOSIS — M5136 Other intervertebral disc degeneration, lumbar region: Secondary | ICD-10-CM | POA: Diagnosis not present

## 2020-10-04 DIAGNOSIS — M9901 Segmental and somatic dysfunction of cervical region: Secondary | ICD-10-CM | POA: Diagnosis not present

## 2020-10-04 DIAGNOSIS — M9903 Segmental and somatic dysfunction of lumbar region: Secondary | ICD-10-CM | POA: Diagnosis not present

## 2020-10-11 DIAGNOSIS — M65311 Trigger thumb, right thumb: Secondary | ICD-10-CM | POA: Diagnosis not present

## 2020-10-25 DIAGNOSIS — Z130 Encounter for screening for diseases of the blood and blood-forming organs and certain disorders involving the immune mechanism: Secondary | ICD-10-CM | POA: Diagnosis not present

## 2020-10-25 DIAGNOSIS — Z124 Encounter for screening for malignant neoplasm of cervix: Secondary | ICD-10-CM | POA: Diagnosis not present

## 2020-10-25 DIAGNOSIS — Z8249 Family history of ischemic heart disease and other diseases of the circulatory system: Secondary | ICD-10-CM | POA: Diagnosis not present

## 2020-10-25 DIAGNOSIS — Z0000 Encounter for general adult medical examination without abnormal findings: Secondary | ICD-10-CM | POA: Diagnosis not present

## 2020-10-25 DIAGNOSIS — Z01419 Encounter for gynecological examination (general) (routine) without abnormal findings: Secondary | ICD-10-CM | POA: Diagnosis not present

## 2020-10-25 DIAGNOSIS — Z1151 Encounter for screening for human papillomavirus (HPV): Secondary | ICD-10-CM | POA: Diagnosis not present

## 2020-10-25 DIAGNOSIS — L292 Pruritus vulvae: Secondary | ICD-10-CM | POA: Diagnosis not present

## 2020-10-25 DIAGNOSIS — Z1231 Encounter for screening mammogram for malignant neoplasm of breast: Secondary | ICD-10-CM | POA: Diagnosis not present

## 2020-10-26 DIAGNOSIS — L292 Pruritus vulvae: Secondary | ICD-10-CM | POA: Diagnosis not present

## 2020-10-26 DIAGNOSIS — L308 Other specified dermatitis: Secondary | ICD-10-CM | POA: Diagnosis not present

## 2020-11-13 DIAGNOSIS — M5136 Other intervertebral disc degeneration, lumbar region: Secondary | ICD-10-CM | POA: Diagnosis not present

## 2020-11-13 DIAGNOSIS — M9901 Segmental and somatic dysfunction of cervical region: Secondary | ICD-10-CM | POA: Diagnosis not present

## 2020-11-13 DIAGNOSIS — M9903 Segmental and somatic dysfunction of lumbar region: Secondary | ICD-10-CM | POA: Diagnosis not present

## 2020-11-13 DIAGNOSIS — M5137 Other intervertebral disc degeneration, lumbosacral region: Secondary | ICD-10-CM | POA: Diagnosis not present

## 2020-11-20 DIAGNOSIS — M65311 Trigger thumb, right thumb: Secondary | ICD-10-CM | POA: Diagnosis not present

## 2020-12-11 DIAGNOSIS — M9901 Segmental and somatic dysfunction of cervical region: Secondary | ICD-10-CM | POA: Diagnosis not present

## 2020-12-11 DIAGNOSIS — M5137 Other intervertebral disc degeneration, lumbosacral region: Secondary | ICD-10-CM | POA: Diagnosis not present

## 2020-12-11 DIAGNOSIS — M9903 Segmental and somatic dysfunction of lumbar region: Secondary | ICD-10-CM | POA: Diagnosis not present

## 2020-12-11 DIAGNOSIS — M5136 Other intervertebral disc degeneration, lumbar region: Secondary | ICD-10-CM | POA: Diagnosis not present

## 2021-01-08 DIAGNOSIS — M9901 Segmental and somatic dysfunction of cervical region: Secondary | ICD-10-CM | POA: Diagnosis not present

## 2021-01-08 DIAGNOSIS — M9903 Segmental and somatic dysfunction of lumbar region: Secondary | ICD-10-CM | POA: Diagnosis not present

## 2021-01-08 DIAGNOSIS — M5136 Other intervertebral disc degeneration, lumbar region: Secondary | ICD-10-CM | POA: Diagnosis not present

## 2021-01-08 DIAGNOSIS — M5137 Other intervertebral disc degeneration, lumbosacral region: Secondary | ICD-10-CM | POA: Diagnosis not present

## 2021-03-06 ENCOUNTER — Other Ambulatory Visit: Payer: Self-pay | Admitting: Allergy

## 2021-04-02 ENCOUNTER — Other Ambulatory Visit: Payer: Self-pay

## 2021-04-02 ENCOUNTER — Ambulatory Visit (INDEPENDENT_AMBULATORY_CARE_PROVIDER_SITE_OTHER): Payer: BC Managed Care – PPO | Admitting: Allergy

## 2021-04-02 ENCOUNTER — Encounter: Payer: Self-pay | Admitting: Allergy

## 2021-04-02 VITALS — BP 112/72 | HR 60 | Temp 97.3°F | Resp 18

## 2021-04-02 DIAGNOSIS — H1010 Acute atopic conjunctivitis, unspecified eye: Secondary | ICD-10-CM

## 2021-04-02 DIAGNOSIS — H1013 Acute atopic conjunctivitis, bilateral: Secondary | ICD-10-CM

## 2021-04-02 DIAGNOSIS — J4530 Mild persistent asthma, uncomplicated: Secondary | ICD-10-CM | POA: Diagnosis not present

## 2021-04-02 DIAGNOSIS — J3089 Other allergic rhinitis: Secondary | ICD-10-CM | POA: Diagnosis not present

## 2021-04-02 DIAGNOSIS — J302 Other seasonal allergic rhinitis: Secondary | ICD-10-CM

## 2021-04-02 MED ORDER — BUDESONIDE-FORMOTEROL FUMARATE 80-4.5 MCG/ACT IN AERO: 2.0000 | INHALATION_SPRAY | Freq: Two times a day (BID) | RESPIRATORY_TRACT | 2 refills | Status: DC

## 2021-04-02 MED ORDER — MONTELUKAST SODIUM 10 MG PO TABS: 10.0000 mg | ORAL_TABLET | Freq: Every day | ORAL | 1 refills | Status: DC

## 2021-04-02 NOTE — Progress Notes (Signed)
Follow Up Note  RE: Marcia Davis MRN: [id number] DOB: 1966-09-02 Date of Office Visit: 04/02/2021  Referring provider: No ref. provider found Primary care provider: Patient, No Pcp Per (Inactive)  Chief Complaint: Medication Refill  History of Present Illness: I had the pleasure of seeing Marcia Davis for a follow up visit at the [facility] on 04/02/2021. She is a 55 y.o. female, who is being followed for asthma, allergic rhinoconjunctivitis. Her previous allergy office visit was on 09/18/2020 with Dr. Selena Batten. Today is a regular follow up visit.  Mild persistent asthma ACT score 19.   Used Breo 1 puff once a day and she doesn't think it worked as well as the [employer]. She is now back on Symbicort 2 puffs once a day but still noticing some chest tightness after walking.  Denies any SOB, coughing, wheezing, nocturnal awakenings, ER/urgent care visits or prednisone use since the last visit.   Seasonal and perennial allergic rhinoconjunctivitis Started Singulair due to some increased symptoms. Not needed to use any eye drops or nasal sprays.  Has some PND and some metallic taste in her mouth in the mornings. Not sure if she wants any other nasal sprays.  No recent nosebleeds.   Assessment and Plan: Marcia Davis is a 55 y.o. female with: Mild persistent asthma, uncomplicated Breo does not work as well as Symbicort. Some increased symptoms with exertion.  Today's spirometry was normal but not as good as prior ones.  ACT score 19. Daily controller medication(s):  Start Symbicort 80 mcg 2 puffs twice a day and rinse mouth afterwards. May use albuterol rescue inhaler 2 puffs every 4 to 6 hours as needed for shortness of breath, chest tightness, coughing, and wheezing. May use albuterol rescue inhaler 2 puffs 5 to 15 minutes prior to strenuous physical activities. Monitor frequency of use.  Get spirometry at next visit.  Seasonal and perennial allergic  rhinoconjunctivitis Past history - 2018 skin testing was positive to grass, weed, tree, dust mites. Interim history - increased symptoms recently and restarted Singulair. Declines additional nasal sprays.  May use Singulair 10 mg daily at night. May use over-the-counter antihistamine such as Zyrtec 10 mg daily in the morning. May use nasal saline spray (i.e., Simply Saline) or nasal saline lavage (i.e., NeilMed) is recommended as needed and prior to medicated nasal sprays. May use Flonase (fluticasone) nasal spray 1 spray per nostril twice a day as needed for nasal congestion.  May use Pazeo 1 drop in each eye as needed daily for itchy eyes. Continue environmental control measures. Nasal saline spray (i.e., Simply Saline) or nasal saline lavage (i.e., NeilMed) is recommended as needed and prior to medicated nasal sprays.  Return in about 6 months (around 10/02/2021).  Meds ordered this encounter  Medications   budesonide-formoterol (SYMBICORT) 80-4.5 MCG/ACT inhaler    Sig: Inhale 2 puffs into the lungs in the morning and at bedtime. rinse mouth after each use.    Dispense:  3 each    Refill:  2   montelukast (SINGULAIR) 10 MG tablet    Sig: Take 1 tablet (10 mg total) by mouth at bedtime.    Dispense:  90 tablet    Refill:  1    Lab Orders  No laboratory test(s) ordered today    Diagnostics: Spirometry:  Tracings reviewed. Her effort: Good reproducible efforts. FVC: 2.52L FEV1: 2.02L, 82% predicted FEV1/FVC ratio: 80% Interpretation: Spirometry consistent with normal pattern.  Please see scanned spirometry results for  details.  Medication List:  Current Outpatient Medications  Medication Sig Dispense Refill   albuterol (PROAIR HFA) 108 (90 Base) MCG/ACT inhaler Inhale 2 puffs into the lungs every 4 (four) hours as needed for wheezing or shortness of breath. 8 g 1   budesonide-formoterol (SYMBICORT) 80-4.5 MCG/ACT inhaler Inhale 2 puffs into the lungs in the morning and at  bedtime. rinse mouth after each use. 3 each 2   montelukast (SINGULAIR) 10 MG tablet Take 1 tablet (10 mg total) by mouth at bedtime. 90 tablet 1   No current facility-administered medications for this visit.   Allergies: Allergies  Allergen Reactions   Codeine     REACTION: nausea   I reviewed her past medical history, social history, family history, and environmental history and no significant changes have been reported from her previous visit.  Review of Systems  Constitutional:  Negative for appetite change, chills, fever and unexpected weight change.  HENT:  Positive for congestion and postnasal drip. Negative for rhinorrhea.   Eyes:  Negative for itching.  Respiratory:  Positive for chest tightness. Negative for cough, shortness of breath and wheezing.   Gastrointestinal:  Negative for abdominal pain.  Skin:  Negative for rash.  Allergic/Immunologic: Positive for environmental allergies.  Neurological:  Negative for headaches.  Objective: BP 112/72 (BP Location: Right Arm, Patient Position: Sitting, Cuff Size: Normal)   Pulse 60   Temp (!) 97.3 F (36.3 C) (Temporal)   Resp 18   SpO2 98%  There is no height or weight on file to calculate BMI. Physical Exam Vitals and nursing note reviewed.  Constitutional:      Appearance: Normal appearance. She is well-developed.  HENT:     Head: Normocephalic and atraumatic.     Right Ear: External ear normal.     Left Ear: External ear normal.     Nose: Nose normal.     Mouth/Throat:     Mouth: Mucous membranes are moist.     Pharynx: Oropharynx is clear.  Eyes:     Conjunctiva/sclera: Conjunctivae normal.  Cardiovascular:     Rate and Rhythm: Normal rate and regular rhythm.     Heart sounds: Normal heart sounds. No murmur heard. Pulmonary:     Effort: Pulmonary effort is normal.     Breath sounds: Normal breath sounds. No wheezing, rhonchi or rales.  Musculoskeletal:     Cervical back: Neck supple.  Skin:    General:  Skin is warm.     Findings: No rash.  Neurological:     Mental Status: She is alert and oriented to person, place, and time.  Psychiatric:        Behavior: Behavior normal.   Previous notes and tests were reviewed. The plan was reviewed with the patient/family, and all questions/concerned were addressed.  It was my pleasure to see Marcia Davis today and participate in her care. Please feel free to contact me with any questions or concerns.  Sincerely,  Wyline Mood, DO Allergy & Immunology  [facility] office: [phone] [facility] office: [phone]

## 2021-04-02 NOTE — Assessment & Plan Note (Signed)
Past history - 2018 skin testing was positive to grass, weed, tree, dust mites. Interim history - increased symptoms recently and restarted Singulair. Declines additional nasal sprays.   May use Singulair 10 mg daily at night.  May use over-the-counter antihistamine such as Zyrtec 10 mg daily in the morning.  May use nasal saline spray (i.e., Simply Saline) or nasal saline lavage (i.e., NeilMed) is recommended as needed and prior to medicated nasal sprays.  May use Flonase (fluticasone) nasal spray 1 spray per nostril twice a day as needed for nasal congestion.   May use Pazeo 1 drop in each eye as needed daily for itchy eyes.  Continue environmental control measures.  Nasal saline spray (i.e., Simply Saline) or nasal saline lavage (i.e., NeilMed) is recommended as needed and prior to medicated nasal sprays.

## 2021-04-02 NOTE — Assessment & Plan Note (Signed)
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 Some increased symptoms with exertion.   Today's spirometry was normal but not as good as prior ones.   ACT score 19. . Daily controller medication(s):  Start Symbicort 80 mcg 2 puffs twice a day and rinse mouth afterwards. . May use albuterol rescue inhaler 2 puffs every 4 to 6 hours as needed for shortness of breath, chest tightness, coughing, and wheezing. May use albuterol rescue inhaler 2 puffs 5 to 15 minutes prior to strenuous physical activities. Monitor frequency of use.  . Get spirometry at next visit.

## 2021-04-02 NOTE — Patient Instructions (Addendum)
Mild persistent asthma Daily controller medication(s):   Start Symbicort 2 puffs twice a day with spacer and rinse mouth afterwards. May use albuterol rescue inhaler 2 puffs every 4 to 6 hours as needed for shortness of breath, chest tightness, coughing, and wheezing. May use albuterol rescue inhaler 2 puffs 5 to 15 minutes prior to strenuous physical activities. Monitor frequency of use.  Asthma control goals:  Full participation in all desired activities (may need albuterol before activity) Albuterol use two times or less a week on average (not counting use with activity) Cough interfering with sleep two times or less a month Oral steroids no more than once a year No hospitalizations   Seasonal and perennial allergic rhinoconjunctivitis 2018 skin testing was positive to grass, weed, tree, dust mites. May use Singulair 10 mg daily at night. May use over-the-counter antihistamine such as Zyrtec 10 mg daily in the morning. May use nasal saline spray (i.e., Simply Saline) or nasal saline lavage (i.e., NeilMed) is recommended as needed and prior to medicated nasal sprays. May use Flonase (fluticasone) nasal spray 1 spray per nostril twice a day as needed for nasal congestion.  May use Pazeo 1 drop in each eye as needed daily for itchy eyes. Continue environmental control measures.  Nose Bleeds: Nosebleeds are very common.  Site of the bleeding is typically on the septum or at the very front of the nose.  Some of the more common causes are from trauma, inflammation or medication induced. Pinch both nostrils while leaning forward for at least 5 minutes before checking to see if the bleeding has stopped. If bleeding is not controlled within 5-10 minutes apply a cotton ball soaked with oxymetazoline (Afrin) to the bleeding nostril for a few seconds.  Preventative treatment: Apply saline nasal gel in each nostril twice a day for 2 weeks to allow the nasal mucosa to heal Consider using a  humidifier in the winter Try to keep your blood pressure as normal as possible (120/80)   Follow up in 6 months or sooner if needed.

## 2021-10-22 ENCOUNTER — Ambulatory Visit: Payer: BC Managed Care – PPO | Admitting: Allergy

## 2022-01-06 ENCOUNTER — Other Ambulatory Visit: Payer: Self-pay | Admitting: Allergy

## 2022-03-21 ENCOUNTER — Other Ambulatory Visit: Payer: Self-pay | Admitting: Allergy

## 2022-05-29 ENCOUNTER — Ambulatory Visit (INDEPENDENT_AMBULATORY_CARE_PROVIDER_SITE_OTHER): Payer: BC Managed Care – PPO | Admitting: Internal Medicine

## 2022-05-29 ENCOUNTER — Encounter: Payer: Self-pay | Admitting: Internal Medicine

## 2022-05-29 VITALS — BP 110/80 | HR 66 | Temp 98.3°F | Ht 61.0 in | Wt 188.3 lb

## 2022-05-29 DIAGNOSIS — E559 Vitamin D deficiency, unspecified: Secondary | ICD-10-CM | POA: Diagnosis not present

## 2022-05-29 DIAGNOSIS — Z0000 Encounter for general adult medical examination without abnormal findings: Secondary | ICD-10-CM

## 2022-05-29 DIAGNOSIS — Z1211 Encounter for screening for malignant neoplasm of colon: Secondary | ICD-10-CM

## 2022-05-29 DIAGNOSIS — E785 Hyperlipidemia, unspecified: Secondary | ICD-10-CM | POA: Diagnosis not present

## 2022-05-29 LAB — CBC WITH DIFFERENTIAL/PLATELET
Basophils Absolute: 0 10*3/uL (ref 0.0–0.1)
Basophils Relative: 0.7 % (ref 0.0–3.0)
Eosinophils Absolute: 0.1 10*3/uL (ref 0.0–0.7)
Eosinophils Relative: 2.9 % (ref 0.0–5.0)
HCT: 36.4 % (ref 36.0–46.0)
Hemoglobin: 11.7 g/dL — ABNORMAL LOW (ref 12.0–15.0)
Lymphocytes Relative: 41 % (ref 12.0–46.0)
Lymphs Abs: 2.1 10*3/uL (ref 0.7–4.0)
MCHC: 32.2 g/dL (ref 30.0–36.0)
MCV: 84 fl (ref 78.0–100.0)
Monocytes Absolute: 0.3 10*3/uL (ref 0.1–1.0)
Monocytes Relative: 5.9 % (ref 3.0–12.0)
Neutro Abs: 2.5 10*3/uL (ref 1.4–7.7)
Neutrophils Relative %: 49.5 % (ref 43.0–77.0)
Platelets: 189 10*3/uL (ref 150.0–400.0)
RBC: 4.34 Mil/uL (ref 3.87–5.11)
RDW: 14.6 % (ref 11.5–15.5)
WBC: 5 10*3/uL (ref 4.0–10.5)

## 2022-05-29 LAB — COMPREHENSIVE METABOLIC PANEL
ALT: 15 U/L (ref 0–35)
AST: 19 U/L (ref 0–37)
Albumin: 4.5 g/dL (ref 3.5–5.2)
Alkaline Phosphatase: 64 U/L (ref 39–117)
BUN: 14 mg/dL (ref 6–23)
CO2: 31 mEq/L (ref 19–32)
Calcium: 9.8 mg/dL (ref 8.4–10.5)
Chloride: 102 mEq/L (ref 96–112)
Creatinine, Ser: 0.66 mg/dL (ref 0.40–1.20)
GFR: 99.41 mL/min (ref >60.00)
Glucose, Bld: 82 mg/dL (ref 70–99)
Potassium: 4.4 mEq/L (ref 3.5–5.1)
Sodium: 141 mEq/L (ref 135–145)
Total Bilirubin: 0.4 mg/dL (ref 0.2–1.2)
Total Protein: 7.5 g/dL (ref 6.0–8.3)

## 2022-05-29 LAB — LIPID PANEL
Cholesterol: 202 mg/dL — ABNORMAL HIGH (ref 0–200)
HDL: 54 mg/dL (ref >39.00)
LDL Cholesterol: 132 mg/dL — ABNORMAL HIGH (ref 0–99)
NonHDL: 148.37
Total CHOL/HDL Ratio: 4
Triglycerides: 82 mg/dL (ref 0.0–149.0)
VLDL: 16.4 mg/dL (ref 0.0–40.0)

## 2022-05-29 LAB — HEMOGLOBIN A1C: Hgb A1c MFr Bld: 6.2 % (ref 4.6–6.5)

## 2022-05-29 LAB — VITAMIN B12: Vitamin B-12: 635 pg/mL (ref 211–911)

## 2022-05-29 LAB — VITAMIN D 25 HYDROXY (VIT D DEFICIENCY, FRACTURES): VITD: 49.3 ng/mL (ref 30.00–100.00)

## 2022-05-29 LAB — TSH: TSH: 1.7 u[IU]/mL (ref 0.35–5.50)

## 2022-05-29 NOTE — Progress Notes (Signed)
Established Patient Office Visit     CC/Reason for Visit: Establish care, annual preventive exam  HPI: Marcia Davis is a 56 y.o. female who is coming in today for the above mentioned reasons. Past Medical History is significant for: Vitamin D deficiency, asthma managed by her allergist on Symbicort, Singulair and as needed albuterol..  She is feeling well and has no acute concerns.  She does not smoke, she does not drink, she has a documented allergy to codeine but it appears to be more of an intolerance with nausea.  No past surgical history.  Family history significant for mother with hypertension, hyperlipidemia and diabetes.  She is overdue for all age-appropriate cancer screening.  She is overdue for all age-appropriate vaccinations.   Past Medical/Surgical History: Past Medical History:  Diagnosis Date   Anxiety    Asthma    Depression    Eczema    Migraine     Past Surgical History:  Procedure Laterality Date   ENDOMETRIAL ABLATION     HER OPTION    Social History:  reports that she has never smoked. She has never used smokeless tobacco. She reports that she does not drink alcohol and does not use drugs.  Allergies: Allergies  Allergen Reactions   Codeine     REACTION: nausea    Family History:  Family History  Problem Relation Age of Onset   Diabetes Mother    Hypertension Mother    Depression Mother    Heart disease Maternal Grandmother    Allergic rhinitis Brother    Asthma Brother    Depression Brother    Allergic rhinitis Son    Asthma Son    Depression Sister    Breast cancer Neg Hx      Current Outpatient Medications:    albuterol (PROAIR HFA) 108 (90 Base) MCG/ACT inhaler, Inhale 2 puffs into the lungs every 4 (four) hours as needed for wheezing or shortness of breath., Disp: 8 g, Rfl: 1   budesonide-formoterol (SYMBICORT) 80-4.5 MCG/ACT inhaler, Inhale 2 puffs into the lungs in the morning and at bedtime. rinse mouth after each use.,  Disp: 3 each, Rfl: 2   montelukast (SINGULAIR) 10 MG tablet, Take 1 tablet (10 mg total) by mouth at bedtime., Disp: 90 tablet, Rfl: 1  Review of Systems:  Constitutional: Denies fever, chills, diaphoresis, appetite change and fatigue.  HEENT: Denies photophobia, eye pain, redness, hearing loss, ear pain, congestion, sore throat, rhinorrhea, sneezing, mouth sores, trouble swallowing, neck pain, neck stiffness and tinnitus.   Respiratory: Denies SOB, DOE, cough, chest tightness,  and wheezing.   Cardiovascular: Denies chest pain, palpitations and leg swelling.  Gastrointestinal: Denies nausea, vomiting, abdominal pain, diarrhea, constipation, blood in stool and abdominal distention.  Genitourinary: Denies dysuria, urgency, frequency, hematuria, flank pain and difficulty urinating.  Endocrine: Denies: hot or cold intolerance, sweats, changes in hair or nails, polyuria, polydipsia. Musculoskeletal: Denies myalgias, back pain, joint swelling, arthralgias and gait problem.  Skin: Denies pallor, rash and wound.  Neurological: Denies dizziness, seizures, syncope, weakness, light-headedness, numbness and headaches.  Hematological: Denies adenopathy. Easy bruising, personal or family bleeding history  Psychiatric/Behavioral: Denies suicidal ideation, mood changes, confusion, nervousness, sleep disturbance and agitation    Physical Exam: Vitals:   05/29/22 1043  BP: 110/80  Pulse: 66  Temp: 98.3 F (36.8 C)  TempSrc: Oral  SpO2: 99%  Weight: 188 lb 4.8 oz (85.4 kg)  Height: 5\' 1"  (1.549 m)    Body mass index is  35.58 kg/m.   Constitutional: NAD, calm, comfortable Eyes: PERRL, lids and conjunctivae normal ENMT: Mucous membranes are moist. Posterior pharynx clear of any exudate or lesions. Normal dentition. Tympanic membrane is pearly white, no erythema or bulging. Neck: normal, supple, no masses, no thyromegaly Respiratory: clear to auscultation bilaterally, no wheezing, no crackles.  Normal respiratory effort. No accessory muscle use.  Cardiovascular: Regular rate and rhythm, no murmurs / rubs / gallops. No extremity edema. 2+ pedal pulses. No carotid bruits.  Abdomen: no tenderness, no masses palpated. No hepatosplenomegaly. Bowel sounds positive.  Musculoskeletal: no clubbing / cyanosis. No joint deformity upper and lower extremities. Good ROM, no contractures. Normal muscle tone.  Skin: no rashes, lesions, ulcers. No induration Neurologic: CN 2-12 grossly intact. Sensation intact, DTR normal. Strength 5/5 in all 4.  Psychiatric: Normal judgment and insight. Alert and oriented x 3. Normal mood.    Impression and Plan:  Encounter for preventive health examination  - Plan: CBC with Differential/Platelet, Comprehensive metabolic panel, Hemoglobin A1c, TSH, Vitamin B12 -Recommend routine eye and dental care. -Immunizations: Declines all immunizations despite counseling -Healthy lifestyle discussed in detail. -Labs to be updated today. -Colon cancer screening: Overdue, GI referral placed -Breast cancer screening: Overdue, she prefers to schedule with GYN -Cervical cancer screening: Overdue, she will schedule with GYN -Lung cancer screening: Never smoker, not applicable -Prostate cancer screening: Not applicable -DEXA: Not applicable  Vitamin D deficiency  - Plan: VITAMIN D 25 Hydroxy (Vit-D Deficiency, Fractures)  Hyperlipidemia, unspecified hyperlipidemia type  - Plan: Lipid panel      Julanne Schlueter Philip Aspen, MD [facility]

## 2022-05-30 ENCOUNTER — Encounter: Payer: Self-pay | Admitting: Internal Medicine

## 2022-05-30 DIAGNOSIS — R7302 Impaired glucose tolerance (oral): Secondary | ICD-10-CM | POA: Insufficient documentation

## 2022-05-30 DIAGNOSIS — E785 Hyperlipidemia, unspecified: Secondary | ICD-10-CM | POA: Insufficient documentation

## 2022-07-10 DIAGNOSIS — M9902 Segmental and somatic dysfunction of thoracic region: Secondary | ICD-10-CM | POA: Diagnosis not present

## 2022-07-10 DIAGNOSIS — M5134 Other intervertebral disc degeneration, thoracic region: Secondary | ICD-10-CM | POA: Diagnosis not present

## 2022-07-10 DIAGNOSIS — M5136 Other intervertebral disc degeneration, lumbar region: Secondary | ICD-10-CM | POA: Diagnosis not present

## 2022-07-10 DIAGNOSIS — M9903 Segmental and somatic dysfunction of lumbar region: Secondary | ICD-10-CM | POA: Diagnosis not present

## 2022-07-11 DIAGNOSIS — M5136 Other intervertebral disc degeneration, lumbar region: Secondary | ICD-10-CM | POA: Diagnosis not present

## 2022-07-11 DIAGNOSIS — M5134 Other intervertebral disc degeneration, thoracic region: Secondary | ICD-10-CM | POA: Diagnosis not present

## 2022-07-11 DIAGNOSIS — M9902 Segmental and somatic dysfunction of thoracic region: Secondary | ICD-10-CM | POA: Diagnosis not present

## 2022-07-11 DIAGNOSIS — M9903 Segmental and somatic dysfunction of lumbar region: Secondary | ICD-10-CM | POA: Diagnosis not present

## 2022-07-15 DIAGNOSIS — M5136 Other intervertebral disc degeneration, lumbar region: Secondary | ICD-10-CM | POA: Diagnosis not present

## 2022-07-15 DIAGNOSIS — M5134 Other intervertebral disc degeneration, thoracic region: Secondary | ICD-10-CM | POA: Diagnosis not present

## 2022-07-15 DIAGNOSIS — M9903 Segmental and somatic dysfunction of lumbar region: Secondary | ICD-10-CM | POA: Diagnosis not present

## 2022-07-15 DIAGNOSIS — M9902 Segmental and somatic dysfunction of thoracic region: Secondary | ICD-10-CM | POA: Diagnosis not present

## 2022-07-16 DIAGNOSIS — M9903 Segmental and somatic dysfunction of lumbar region: Secondary | ICD-10-CM | POA: Diagnosis not present

## 2022-07-16 DIAGNOSIS — M5134 Other intervertebral disc degeneration, thoracic region: Secondary | ICD-10-CM | POA: Diagnosis not present

## 2022-07-16 DIAGNOSIS — M5136 Other intervertebral disc degeneration, lumbar region: Secondary | ICD-10-CM | POA: Diagnosis not present

## 2022-07-16 DIAGNOSIS — M9902 Segmental and somatic dysfunction of thoracic region: Secondary | ICD-10-CM | POA: Diagnosis not present

## 2022-07-18 DIAGNOSIS — M5134 Other intervertebral disc degeneration, thoracic region: Secondary | ICD-10-CM | POA: Diagnosis not present

## 2022-07-18 DIAGNOSIS — M9902 Segmental and somatic dysfunction of thoracic region: Secondary | ICD-10-CM | POA: Diagnosis not present

## 2022-07-18 DIAGNOSIS — M9903 Segmental and somatic dysfunction of lumbar region: Secondary | ICD-10-CM | POA: Diagnosis not present

## 2022-07-18 DIAGNOSIS — M5136 Other intervertebral disc degeneration, lumbar region: Secondary | ICD-10-CM | POA: Diagnosis not present

## 2022-07-22 DIAGNOSIS — M5134 Other intervertebral disc degeneration, thoracic region: Secondary | ICD-10-CM | POA: Diagnosis not present

## 2022-07-22 DIAGNOSIS — M9902 Segmental and somatic dysfunction of thoracic region: Secondary | ICD-10-CM | POA: Diagnosis not present

## 2022-07-22 DIAGNOSIS — M9903 Segmental and somatic dysfunction of lumbar region: Secondary | ICD-10-CM | POA: Diagnosis not present

## 2022-07-22 DIAGNOSIS — M5136 Other intervertebral disc degeneration, lumbar region: Secondary | ICD-10-CM | POA: Diagnosis not present

## 2022-07-29 DIAGNOSIS — M9902 Segmental and somatic dysfunction of thoracic region: Secondary | ICD-10-CM | POA: Diagnosis not present

## 2022-07-29 DIAGNOSIS — M9903 Segmental and somatic dysfunction of lumbar region: Secondary | ICD-10-CM | POA: Diagnosis not present

## 2022-07-29 DIAGNOSIS — M5134 Other intervertebral disc degeneration, thoracic region: Secondary | ICD-10-CM | POA: Diagnosis not present

## 2022-07-29 DIAGNOSIS — M5136 Other intervertebral disc degeneration, lumbar region: Secondary | ICD-10-CM | POA: Diagnosis not present

## 2022-08-05 DIAGNOSIS — M9903 Segmental and somatic dysfunction of lumbar region: Secondary | ICD-10-CM | POA: Diagnosis not present

## 2022-08-05 DIAGNOSIS — M9902 Segmental and somatic dysfunction of thoracic region: Secondary | ICD-10-CM | POA: Diagnosis not present

## 2022-08-05 DIAGNOSIS — M5136 Other intervertebral disc degeneration, lumbar region: Secondary | ICD-10-CM | POA: Diagnosis not present

## 2022-08-05 DIAGNOSIS — M5134 Other intervertebral disc degeneration, thoracic region: Secondary | ICD-10-CM | POA: Diagnosis not present

## 2022-08-12 DIAGNOSIS — M5136 Other intervertebral disc degeneration, lumbar region: Secondary | ICD-10-CM | POA: Diagnosis not present

## 2022-08-12 DIAGNOSIS — M9903 Segmental and somatic dysfunction of lumbar region: Secondary | ICD-10-CM | POA: Diagnosis not present

## 2022-08-12 DIAGNOSIS — M5134 Other intervertebral disc degeneration, thoracic region: Secondary | ICD-10-CM | POA: Diagnosis not present

## 2022-08-12 DIAGNOSIS — M9902 Segmental and somatic dysfunction of thoracic region: Secondary | ICD-10-CM | POA: Diagnosis not present

## 2022-08-21 DIAGNOSIS — M5134 Other intervertebral disc degeneration, thoracic region: Secondary | ICD-10-CM | POA: Diagnosis not present

## 2022-08-21 DIAGNOSIS — M9902 Segmental and somatic dysfunction of thoracic region: Secondary | ICD-10-CM | POA: Diagnosis not present

## 2022-08-21 DIAGNOSIS — M9903 Segmental and somatic dysfunction of lumbar region: Secondary | ICD-10-CM | POA: Diagnosis not present

## 2022-08-21 DIAGNOSIS — M5136 Other intervertebral disc degeneration, lumbar region: Secondary | ICD-10-CM | POA: Diagnosis not present

## 2022-08-26 DIAGNOSIS — M9902 Segmental and somatic dysfunction of thoracic region: Secondary | ICD-10-CM | POA: Diagnosis not present

## 2022-08-26 DIAGNOSIS — M9903 Segmental and somatic dysfunction of lumbar region: Secondary | ICD-10-CM | POA: Diagnosis not present

## 2022-08-26 DIAGNOSIS — M5136 Other intervertebral disc degeneration, lumbar region: Secondary | ICD-10-CM | POA: Diagnosis not present

## 2022-08-26 DIAGNOSIS — M5134 Other intervertebral disc degeneration, thoracic region: Secondary | ICD-10-CM | POA: Diagnosis not present

## 2022-09-09 DIAGNOSIS — M5136 Other intervertebral disc degeneration, lumbar region: Secondary | ICD-10-CM | POA: Diagnosis not present

## 2022-09-09 DIAGNOSIS — M9903 Segmental and somatic dysfunction of lumbar region: Secondary | ICD-10-CM | POA: Diagnosis not present

## 2022-09-09 DIAGNOSIS — M5134 Other intervertebral disc degeneration, thoracic region: Secondary | ICD-10-CM | POA: Diagnosis not present

## 2022-09-09 DIAGNOSIS — M9902 Segmental and somatic dysfunction of thoracic region: Secondary | ICD-10-CM | POA: Diagnosis not present

## 2022-09-25 DIAGNOSIS — M5134 Other intervertebral disc degeneration, thoracic region: Secondary | ICD-10-CM | POA: Diagnosis not present

## 2022-09-25 DIAGNOSIS — M5136 Other intervertebral disc degeneration, lumbar region: Secondary | ICD-10-CM | POA: Diagnosis not present

## 2022-09-25 DIAGNOSIS — M9902 Segmental and somatic dysfunction of thoracic region: Secondary | ICD-10-CM | POA: Diagnosis not present

## 2022-09-25 DIAGNOSIS — M9903 Segmental and somatic dysfunction of lumbar region: Secondary | ICD-10-CM | POA: Diagnosis not present

## 2022-09-26 DIAGNOSIS — M5431 Sciatica, right side: Secondary | ICD-10-CM | POA: Diagnosis not present

## 2022-12-02 ENCOUNTER — Ambulatory Visit: Payer: BC Managed Care – PPO | Admitting: Internal Medicine

## 2023-01-28 ENCOUNTER — Ambulatory Visit: Payer: BC Managed Care – PPO | Admitting: Internal Medicine

## 2023-01-28 ENCOUNTER — Encounter: Payer: Self-pay | Admitting: Internal Medicine

## 2023-01-28 ENCOUNTER — Other Ambulatory Visit: Payer: Self-pay

## 2023-01-28 VITALS — BP 136/78 | HR 60 | Temp 98.2°F | Ht 62.0 in | Wt 192.0 lb

## 2023-01-28 DIAGNOSIS — J4530 Mild persistent asthma, uncomplicated: Secondary | ICD-10-CM | POA: Diagnosis not present

## 2023-01-28 DIAGNOSIS — H1013 Acute atopic conjunctivitis, bilateral: Secondary | ICD-10-CM | POA: Diagnosis not present

## 2023-01-28 DIAGNOSIS — H1010 Acute atopic conjunctivitis, unspecified eye: Secondary | ICD-10-CM

## 2023-01-28 DIAGNOSIS — J3089 Other allergic rhinitis: Secondary | ICD-10-CM

## 2023-01-28 DIAGNOSIS — J302 Other seasonal allergic rhinitis: Secondary | ICD-10-CM | POA: Diagnosis not present

## 2023-01-28 MED ORDER — ALBUTEROL SULFATE HFA 108 (90 BASE) MCG/ACT IN AERS: 2.0000 | INHALATION_SPRAY | RESPIRATORY_TRACT | 1 refills | Status: DC | PRN

## 2023-01-28 MED ORDER — BUDESONIDE-FORMOTEROL FUMARATE 80-4.5 MCG/ACT IN AERO: 2.0000 | INHALATION_SPRAY | Freq: Every day | RESPIRATORY_TRACT | 3 refills | Status: AC

## 2023-01-28 MED ORDER — MONTELUKAST SODIUM 10 MG PO TABS: 10.0000 mg | ORAL_TABLET | Freq: Every day | ORAL | 3 refills | Status: AC

## 2023-01-28 MED ORDER — MONTELUKAST SODIUM 10 MG PO TABS: 10.0000 mg | ORAL_TABLET | Freq: Every day | ORAL | 1 refills | Status: DC

## 2023-01-28 MED ORDER — BUDESONIDE-FORMOTEROL FUMARATE 80-4.5 MCG/ACT IN AERO: 2.0000 | INHALATION_SPRAY | Freq: Every day | RESPIRATORY_TRACT | 1 refills | Status: DC

## 2023-01-28 NOTE — Addendum Note (Signed)
Addended by: Kellie Simmering, Amariona Rathje on: 01/28/2023 06:05 PM   Modules accepted: Orders

## 2023-01-28 NOTE — Progress Notes (Signed)
   FOLLOW UP Date of Service/Encounter:  01/28/23   Subjective:  Marcia Davis (DOB: 1966-08-23) is a 57 y.o. female who [facility] for follow up for mild persistent asthma and allergic rhinoconjunctivitis.   History obtained from: chart review and patient.  Asthma: Doing well since last visit.  No issues with flare ups. No oral prednisone or ER visits since last visit.  Taking Symbicort 2 puffs daily; she does notice worsening symptoms if she forgets it. Rare albuterol use  Rhinitis: Doing well, with minimal symptoms.  She is taking Singulair.  Does not like nose sprays or anti histamines or eye drops.  Wishes to take minimal medications.     Past Medical History: Past Medical History:  Diagnosis Date   Anxiety    Asthma    Depression    Eczema    Migraine     Objective:  There were no vitals taken for this visit. There is no height or weight on file to calculate BMI. Physical Exam: GEN: alert, well developed HEENT: clear conjunctiva, TM grey and translucent, nose with moderate inferior turbinate hypertrophy, pink nasal mucosa, clear rhinorrhea, no cobblestoning HEART: regular rate and rhythm, no murmur LUNGS: clear to auscultation bilaterally, no coughing, unlabored respiration SKIN: no rashes or lesions  Spirometry:  Tracings reviewed. Her effort: Good reproducible efforts. FVC: 2.73L FEV1: 2.31L, 100% predicted FEV1/FVC ratio: 85% Interpretation: Spirometry consistent with normal pattern.  Please see scanned spirometry results for details.   Assessment:   1. Mild persistent asthma, uncomplicated   2. Seasonal and perennial allergic rhinoconjunctivitis     Plan/Recommendations:  Mild Persistent Asthma  - Well controlled on ICS/LABA. Spirometry today was normal - Maintenance inhaler: continue Symbicort 2 puffs daily. - With respiratory illness or flare ups, increase to Symbicort 2 puffs twice daily  for 1-2 weeks.  - Rescue inhaler: Albuterol 2 puffs via spacer or 1 vial via nebulizer every 4-6 hours as needed for respiratory symptoms of cough, shortness of breath, or wheezing Asthma control goals:  Full participation in all desired activities (may need albuterol before activity) Albuterol use two times or less a week on average (not counting use with activity) Cough interfering with sleep two times or less a month Oral steroids no more than once a year No hospitalizations   Seasonal and perennial allergic rhinoconjunctivitis - Well controlled 2018 skin testing was positive to grass, weed, tree, dust mites - Use nasal saline rinses before nose sprays such as with Neilmed Sinus Rinse.  Use distilled water.   - Use Zyrtec 10 mg daily as needed for sneezing, itchy watery eyes, runny nose. - Use Singulair  daily.     Return in about 1 year (around 01/28/2024).  Alesia Morin, MD [facility]

## 2023-01-28 NOTE — Patient Instructions (Addendum)
Mild Persistent Asthma  - Maintenance inhaler: continue Symbicort 2 puffs daily. - With respiratory illness or flare ups, increase to Symbicort 2 puffs twice daily for 1-2 weeks.  - Rescue inhaler: Albuterol 2 puffs via spacer or 1 vial via nebulizer every 4-6 hours as needed for respiratory symptoms of cough, shortness of breath, or wheezing Asthma control goals:  Full participation in all desired activities (may need albuterol before activity) Albuterol use two times or less a week on average (not counting use with activity) Cough interfering with sleep two times or less a month Oral steroids no more than once a year No hospitalizations   Seasonal and perennial allergic rhinoconjunctivitis 2018 skin testing was positive to grass, weed, tree, dust mites - Use nasal saline rinses before nose sprays such as with Neilmed Sinus Rinse.  Use distilled water.   - Use Zyrtec 10 mg daily as needed for sneezing, itchy watery eyes, runny nose. - Use Singulair  daily.       Follow up in 12 months or sooner.

## 2023-01-29 NOTE — Addendum Note (Signed)
Addended by: Rolland Bimler D on: 01/29/2023 05:15 PM   Modules accepted: Orders

## 2023-02-03 ENCOUNTER — Other Ambulatory Visit: Payer: Self-pay | Admitting: Internal Medicine

## 2023-02-03 ENCOUNTER — Telehealth: Payer: Self-pay | Admitting: Internal Medicine

## 2023-02-03 MED ORDER — LEVALBUTEROL TARTRATE 45 MCG/ACT IN AERO: 2.0000 | INHALATION_SPRAY | Freq: Four times a day (QID) | RESPIRATORY_TRACT | 1 refills | Status: AC | PRN

## 2023-02-03 MED ORDER — FLUTICASONE FUROATE-VILANTEROL 100-25 MCG/ACT IN AEPB: 1.0000 | INHALATION_SPRAY | Freq: Every day | RESPIRATORY_TRACT | 11 refills | Status: DC

## 2023-02-03 NOTE — Telephone Encounter (Signed)
Patient called and stated that her insurance company is not covering her symbicort and albuterol. Patient is requesting something else in its place. Patients insurance is [employer]. Patients pharmacy is [facility] in [place]. Patients call back number is [phone]

## 2023-02-03 NOTE — Progress Notes (Signed)
Symbicort/Albuterol not covered.  Looks like Breo/Xopenex might be.

## 2023-02-04 NOTE — Telephone Encounter (Signed)
Patient called back and said she would prefer to stay on Symbicort because it takes her body about 6 months to get use to another medication. She said her insurance will pay for Symbicort, but they need verbal consent from the provider stating no other medications will work. She gave me a number to call for the verbal authorization. [phone]. She would also like a call back at [phone] to let her know the outcome of this.

## 2023-02-05 ENCOUNTER — Telehealth: Payer: Self-pay

## 2023-02-05 ENCOUNTER — Other Ambulatory Visit (HOSPITAL_COMMUNITY): Payer: Self-pay

## 2023-02-05 NOTE — Telephone Encounter (Signed)
Patient Advocate Encounter   Received notification from [employer] that prior authorization is required for Symbicort 80-4.5MCG/ACT aerosol   Submitted: n/a Key [id number]  Awaiting generation of clinical questions

## 2023-02-07 NOTE — Telephone Encounter (Signed)
PA is being submitted and is awaiting determination, will be updated in additional encounter created.

## 2023-02-10 ENCOUNTER — Other Ambulatory Visit: Payer: Self-pay | Admitting: *Deleted

## 2023-02-10 ENCOUNTER — Other Ambulatory Visit: Payer: Self-pay | Admitting: Internal Medicine

## 2023-02-10 MED ORDER — FLUTICASONE-SALMETEROL 250-50 MCG/ACT IN AEPB: 1.0000 | INHALATION_SPRAY | Freq: Two times a day (BID) | RESPIRATORY_TRACT | 5 refills | Status: DC

## 2023-02-10 NOTE — Telephone Encounter (Signed)
PA has been DENIED due to:   [facility] [employer] received your recent request for coverage of SYMBICORT 80-4.5 INH. Your request was denied based on the terms of your prescription benefit plan. This is the initial adverse coverage determination for this request. The reason for the denial was: *Drug Not Covered/Plan Exclusion - May Be Covered Under Medical- Your request for coverage was denied because your prescription benefit plan does not cover the requested medication; may be covered under medical plan

## 2023-02-10 NOTE — Telephone Encounter (Signed)
Unfortunately insurance denied Symbicort PA stating that it is not a covered benefit. Do you want to recommend some alternatives and we can look at coverage with her insurance?

## 2023-02-10 NOTE — Telephone Encounter (Signed)
Generic Advair Diskus is preferred at a tier 1.

## 2023-02-10 NOTE — Progress Notes (Signed)
Symbicort PA denied. Tried Breo in the past but did not do well. Will try alternative, Advair Diskus.

## 2023-08-13 ENCOUNTER — Other Ambulatory Visit: Payer: Self-pay | Admitting: Internal Medicine

## 2023-12-23 ENCOUNTER — Telehealth: Payer: Self-pay | Admitting: Internal Medicine

## 2023-12-23 NOTE — Telephone Encounter (Signed)
 Pt said she was healthy went to [place] to see her grandson. She is doing inhaler and neb. The congestion in her head is horrible and she is coughing a lot worse at night. Constant post nasal drip. No fever, light body aches in back. She has tried mucinex and other otc meds for cough but nothings helping. Did tell pt about getting otc flu and covid testing. She does not want to test she just wants something to help her with her cough but specifically at night.

## 2023-12-23 NOTE — Telephone Encounter (Signed)
 Patient called stating she is not feeling well and is wanting medication called in. Patient would like a call back from a nurse.

## 2023-12-23 NOTE — Telephone Encounter (Signed)
 Pt informed of this and said thank you she dosent want to be tested at this time

## 2024-02-25 ENCOUNTER — Other Ambulatory Visit: Payer: Self-pay | Admitting: Internal Medicine

## 2024-02-25 ENCOUNTER — Telehealth: Payer: Self-pay

## 2024-02-25 NOTE — Telephone Encounter (Signed)
 LVM for patient to return call. Pt needing to schedule a follow up appointment and medication refill. Last seen 01/28/23

## 2024-03-05 MED ORDER — FLUTICASONE-SALMETEROL 250-50 MCG/ACT IN AEPB: 1.0000 | INHALATION_SPRAY | Freq: Two times a day (BID) | RESPIRATORY_TRACT | 0 refills | Status: AC

## 2024-03-05 MED ORDER — ALBUTEROL SULFATE HFA 108 (90 BASE) MCG/ACT IN AERS: 2.0000 | INHALATION_SPRAY | RESPIRATORY_TRACT | 0 refills | Status: AC | PRN

## 2024-03-05 NOTE — Telephone Encounter (Signed)
 Called and left a message for patient to call our office back to get her scheduled and inform her that courtesy refills can be sent if she'd like to schedule with a Doctor within the next few weeks.

## 2024-03-05 NOTE — Addendum Note (Signed)
 Addended by: Anthon Baston A on: 03/05/2024 10:15 AM   Modules accepted: Orders
